# Patient Record
Sex: Female | Born: 1975 | Race: Black or African American | Hispanic: No | Marital: Single | Smoking: Current every day smoker
Health system: Southern US, Community
[De-identification: ages and names within clinical notes are randomized; demographics above are authoritative.]

## PROBLEM LIST (undated history)

## (undated) ENCOUNTER — Inpatient Hospital Stay (HOSPITAL_COMMUNITY): Payer: Self-pay

## (undated) DIAGNOSIS — J45909 Unspecified asthma, uncomplicated: Secondary | ICD-10-CM

## (undated) DIAGNOSIS — R51 Headache: Secondary | ICD-10-CM

## (undated) DIAGNOSIS — N390 Urinary tract infection, site not specified: Secondary | ICD-10-CM

## (undated) HISTORY — PX: NO PAST SURGERIES: SHX2092

---

## 2001-02-08 ENCOUNTER — Emergency Department (HOSPITAL_COMMUNITY): Admission: EM | Admit: 2001-02-08 | Discharge: 2001-02-08 | Payer: Self-pay | Admitting: Internal Medicine

## 2001-07-22 ENCOUNTER — Emergency Department (HOSPITAL_COMMUNITY): Admission: EM | Admit: 2001-07-22 | Discharge: 2001-07-22 | Payer: Self-pay | Admitting: Emergency Medicine

## 2001-07-22 ENCOUNTER — Encounter: Payer: Self-pay | Admitting: Emergency Medicine

## 2001-08-17 ENCOUNTER — Emergency Department (HOSPITAL_COMMUNITY): Admission: EM | Admit: 2001-08-17 | Discharge: 2001-08-17 | Payer: Self-pay | Admitting: *Deleted

## 2002-06-13 ENCOUNTER — Emergency Department (HOSPITAL_COMMUNITY): Admission: EM | Admit: 2002-06-13 | Discharge: 2002-06-13 | Payer: Self-pay | Admitting: Emergency Medicine

## 2003-03-01 ENCOUNTER — Emergency Department (HOSPITAL_COMMUNITY): Admission: EM | Admit: 2003-03-01 | Discharge: 2003-03-01 | Payer: Self-pay | Admitting: Emergency Medicine

## 2003-09-24 ENCOUNTER — Emergency Department (HOSPITAL_COMMUNITY): Admission: EM | Admit: 2003-09-24 | Discharge: 2003-09-24 | Payer: Self-pay | Admitting: Emergency Medicine

## 2005-11-13 ENCOUNTER — Emergency Department (HOSPITAL_COMMUNITY): Admission: EM | Admit: 2005-11-13 | Discharge: 2005-11-14 | Payer: Self-pay | Admitting: Emergency Medicine

## 2006-05-07 ENCOUNTER — Emergency Department (HOSPITAL_COMMUNITY): Admission: EM | Admit: 2006-05-07 | Discharge: 2006-05-07 | Payer: Self-pay | Admitting: Emergency Medicine

## 2006-10-24 ENCOUNTER — Emergency Department (HOSPITAL_COMMUNITY): Admission: EM | Admit: 2006-10-24 | Discharge: 2006-10-24 | Payer: Self-pay | Admitting: Emergency Medicine

## 2007-06-14 ENCOUNTER — Emergency Department (HOSPITAL_COMMUNITY): Admission: EM | Admit: 2007-06-14 | Discharge: 2007-06-14 | Payer: Self-pay | Admitting: Emergency Medicine

## 2007-06-22 ENCOUNTER — Emergency Department (HOSPITAL_COMMUNITY): Admission: EM | Admit: 2007-06-22 | Discharge: 2007-06-22 | Payer: Self-pay | Admitting: Emergency Medicine

## 2007-10-24 ENCOUNTER — Emergency Department (HOSPITAL_COMMUNITY): Admission: EM | Admit: 2007-10-24 | Discharge: 2007-10-24 | Payer: Self-pay | Admitting: Emergency Medicine

## 2008-01-06 ENCOUNTER — Emergency Department (HOSPITAL_COMMUNITY): Admission: EM | Admit: 2008-01-06 | Discharge: 2008-01-06 | Payer: Self-pay | Admitting: Emergency Medicine

## 2008-07-15 ENCOUNTER — Emergency Department (HOSPITAL_COMMUNITY): Admission: EM | Admit: 2008-07-15 | Discharge: 2008-07-15 | Payer: Self-pay | Admitting: Emergency Medicine

## 2009-01-25 ENCOUNTER — Emergency Department (HOSPITAL_COMMUNITY): Admission: EM | Admit: 2009-01-25 | Discharge: 2009-01-25 | Payer: Self-pay | Admitting: Family Medicine

## 2009-02-17 ENCOUNTER — Emergency Department (HOSPITAL_COMMUNITY): Admission: EM | Admit: 2009-02-17 | Discharge: 2009-02-17 | Payer: Self-pay | Admitting: Emergency Medicine

## 2010-12-06 ENCOUNTER — Emergency Department (HOSPITAL_COMMUNITY)
Admission: EM | Admit: 2010-12-06 | Discharge: 2010-12-07 | Disposition: A | Discharge status: Home / Self Care | Payer: Self-pay | Attending: Emergency Medicine | Admitting: Emergency Medicine

## 2010-12-06 DIAGNOSIS — N12 Tubulo-interstitial nephritis, not specified as acute or chronic: Secondary | ICD-10-CM | POA: Insufficient documentation

## 2010-12-06 DIAGNOSIS — R112 Nausea with vomiting, unspecified: Secondary | ICD-10-CM | POA: Insufficient documentation

## 2010-12-06 DIAGNOSIS — J45909 Unspecified asthma, uncomplicated: Secondary | ICD-10-CM | POA: Insufficient documentation

## 2010-12-06 DIAGNOSIS — Z79899 Other long term (current) drug therapy: Secondary | ICD-10-CM | POA: Insufficient documentation

## 2010-12-06 LAB — CBC
HCT: 34.9 % — ABNORMAL LOW (ref 36.0–46.0)
Hemoglobin: 11.9 g/dL — ABNORMAL LOW (ref 12.0–15.0)
MCHC: 34.1 g/dL (ref 30.0–36.0)
MCV: 87.3 fL (ref 78.0–100.0)
WBC: 9.3 10*3/uL (ref 4.0–10.5)

## 2010-12-06 LAB — URINE MICROSCOPIC-ADD ON

## 2010-12-06 LAB — PREGNANCY, URINE: Preg Test, Ur: NEGATIVE

## 2010-12-06 LAB — BASIC METABOLIC PANEL
BUN: 3 mg/dL — ABNORMAL LOW (ref 6–23)
CO2: 27 mEq/L (ref 19–32)
Glucose, Bld: 104 mg/dL — ABNORMAL HIGH (ref 70–99)
Potassium: 2.8 mEq/L — ABNORMAL LOW (ref 3.5–5.1)
Sodium: 135 mEq/L (ref 135–145)

## 2010-12-06 LAB — DIFFERENTIAL
Basophils Absolute: 0 10*3/uL (ref 0.0–0.1)
Lymphocytes Relative: 17 % (ref 12–46)
Lymphs Abs: 1.6 10*3/uL (ref 0.7–4.0)
Monocytes Absolute: 1.4 10*3/uL — ABNORMAL HIGH (ref 0.1–1.0)
Neutro Abs: 6.3 10*3/uL (ref 1.7–7.7)

## 2010-12-06 LAB — URINALYSIS, ROUTINE W REFLEX MICROSCOPIC
Bilirubin Urine: NEGATIVE
Specific Gravity, Urine: 1.01 (ref 1.005–1.030)
pH: 6 (ref 5.0–8.0)

## 2010-12-11 LAB — WET PREP, GENITAL

## 2010-12-11 LAB — DIFFERENTIAL
Basophils Relative: 0 % (ref 0–1)
Eosinophils Absolute: 0.1 10*3/uL (ref 0.0–0.7)
Lymphs Abs: 1.3 10*3/uL (ref 0.7–4.0)
Neutrophils Relative %: 80 % — ABNORMAL HIGH (ref 43–77)

## 2010-12-11 LAB — POCT PREGNANCY, URINE
Preg Test, Ur: NEGATIVE
Preg Test, Ur: NEGATIVE

## 2010-12-11 LAB — BASIC METABOLIC PANEL
BUN: 5 mg/dL — ABNORMAL LOW (ref 6–23)
Chloride: 100 mEq/L (ref 96–112)
Creatinine, Ser: 0.79 mg/dL (ref 0.4–1.2)

## 2010-12-11 LAB — GC/CHLAMYDIA PROBE AMP, GENITAL
Chlamydia, DNA Probe: NEGATIVE
GC Probe Amp, Genital: NEGATIVE

## 2010-12-11 LAB — URINALYSIS, ROUTINE W REFLEX MICROSCOPIC
Ketones, ur: NEGATIVE mg/dL
Nitrite: NEGATIVE
Protein, ur: NEGATIVE mg/dL
Urobilinogen, UA: 1 mg/dL (ref 0.0–1.0)

## 2010-12-11 LAB — CBC
MCHC: 34.7 g/dL (ref 30.0–36.0)
MCV: 89.9 fL (ref 78.0–100.0)
Platelets: 249 10*3/uL (ref 150–400)
WBC: 10.9 10*3/uL — ABNORMAL HIGH (ref 4.0–10.5)

## 2010-12-11 LAB — URINE MICROSCOPIC-ADD ON

## 2011-12-19 ENCOUNTER — Encounter (HOSPITAL_COMMUNITY): Payer: Self-pay | Admitting: Emergency Medicine

## 2011-12-19 ENCOUNTER — Emergency Department (HOSPITAL_COMMUNITY): Payer: Self-pay

## 2011-12-19 ENCOUNTER — Emergency Department (HOSPITAL_COMMUNITY)
Admission: EM | Admit: 2011-12-19 | Discharge: 2011-12-19 | Disposition: A | Discharge status: Home / Self Care | Payer: Self-pay | Attending: Emergency Medicine | Admitting: Emergency Medicine

## 2011-12-19 DIAGNOSIS — N390 Urinary tract infection, site not specified: Secondary | ICD-10-CM | POA: Insufficient documentation

## 2011-12-19 DIAGNOSIS — M549 Dorsalgia, unspecified: Secondary | ICD-10-CM | POA: Insufficient documentation

## 2011-12-19 DIAGNOSIS — J45909 Unspecified asthma, uncomplicated: Secondary | ICD-10-CM | POA: Insufficient documentation

## 2011-12-19 HISTORY — DX: Urinary tract infection, site not specified: N39.0

## 2011-12-19 LAB — POCT I-STAT, CHEM 8
Calcium, Ion: 1.18 mmol/L (ref 1.12–1.32)
Chloride: 102 mEq/L (ref 96–112)
Glucose, Bld: 104 mg/dL — ABNORMAL HIGH (ref 70–99)
HCT: 40 % (ref 36.0–46.0)
TCO2: 26 mmol/L (ref 0–100)

## 2011-12-19 LAB — URINALYSIS, ROUTINE W REFLEX MICROSCOPIC
Bilirubin Urine: NEGATIVE
Glucose, UA: NEGATIVE mg/dL
Ketones, ur: NEGATIVE mg/dL
Protein, ur: 100 mg/dL — AB

## 2011-12-19 LAB — URINE MICROSCOPIC-ADD ON

## 2011-12-19 LAB — CBC
HCT: 38.4 % (ref 36.0–46.0)
Hemoglobin: 13 g/dL (ref 12.0–15.0)
MCH: 29.3 pg (ref 26.0–34.0)
MCHC: 33.9 g/dL (ref 30.0–36.0)

## 2011-12-19 LAB — BASIC METABOLIC PANEL
Chloride: 102 mEq/L (ref 96–112)
GFR calc Af Amer: >90 mL/min (ref >90)
GFR calc non Af Amer: 80 mL/min — ABNORMAL LOW (ref >90)
Potassium: 3.7 mEq/L (ref 3.5–5.1)
Sodium: 136 mEq/L (ref 135–145)

## 2011-12-19 LAB — DIFFERENTIAL
Basophils Absolute: 0 10*3/uL (ref 0.0–0.1)
Basophils Relative: 0 % (ref 0–1)
Eosinophils Absolute: 0.1 10*3/uL (ref 0.0–0.7)
Neutro Abs: 6.6 10*3/uL (ref 1.7–7.7)
Neutrophils Relative %: 65 % (ref 43–77)

## 2011-12-19 MED ORDER — CIPROFLOXACIN IN D5W 400 MG/200ML IV SOLN
400.0000 mg | Freq: Once | INTRAVENOUS | Status: AC
  Administered 2011-12-19: 400 mg via INTRAVENOUS
  Filled 2011-12-19: qty 200

## 2011-12-19 MED ORDER — MORPHINE SULFATE 4 MG/ML IJ SOLN
4.0000 mg | Freq: Once | INTRAMUSCULAR | Status: AC
  Administered 2011-12-19: 4 mg via INTRAVENOUS
  Filled 2011-12-19: qty 1

## 2011-12-19 MED ORDER — NITROFURANTOIN MONOHYD MACRO 100 MG PO CAPS: 100.0000 mg | ORAL_CAPSULE | Freq: Two times a day (BID) | ORAL | Status: AC

## 2011-12-19 MED ORDER — HYDROCODONE-ACETAMINOPHEN 5-500 MG PO TABS: 1.0000 | ORAL_TABLET | Freq: Four times a day (QID) | ORAL | Status: AC | PRN

## 2011-12-19 MED ORDER — PROMETHAZINE HCL 25 MG/ML IJ SOLN
12.5000 mg | Freq: Once | INTRAMUSCULAR | Status: AC
  Administered 2011-12-19: 12.5 mg via INTRAVENOUS
  Filled 2011-12-19: qty 1

## 2011-12-19 MED ORDER — IBUPROFEN 800 MG PO TABS: 800.0000 mg | ORAL_TABLET | Freq: Three times a day (TID) | ORAL | Status: AC

## 2011-12-19 NOTE — ED Notes (Signed)
Patient transported to CT 

## 2011-12-19 NOTE — ED Notes (Signed)
PT to ED c/o acute onset R side pain that radiates to R flank.  Pt denies nvd.  Pt denies urinary s/s, vaginal drainage.  Pt states that at first she felt that she had "slept on her side wrong".

## 2011-12-19 NOTE — ED Notes (Signed)
Patient reporting sharp right upper abdominal quadrant pain that radiates to her right flank area.  Denies history of kidney stones.  Patient reports urinary tract infections and feels that these symptoms are similar to past UTI diagnoses.  Patient does reports urinary frequency; denies other urinary symptoms.  Patient denies chest pain and shortness of breath.  Reports nausea; denies vomiting.

## 2011-12-19 NOTE — ED Notes (Signed)
Lab at bedside

## 2011-12-19 NOTE — Discharge Instructions (Signed)
Back Pain, Adult Resting take antibiotics as prescribed. Sure to drink plenty of fluids. Followup in 2 days for recheck and culture results. Return here for any worsening condition.   Low back pain is very common. About 1 in 5 people have back pain.The cause of low back pain is rarely dangerous. The pain often gets better over time.About half of people with a sudden onset of back pain feel better in just 2 weeks. About 8 in 10 people feel better by 6 weeks.  CAUSES Some common causes of back pain include:  Strain of the muscles or ligaments supporting the spine.   Wear and tear (degeneration) of the spinal discs.   Arthritis.   Direct injury to the back.  DIAGNOSIS Most of the time, the direct cause of low back pain is not known.However, back pain can be treated effectively even when the exact cause of the pain is unknown.Answering your caregiver's questions about your overall health and symptoms is one of the most accurate ways to make sure the cause of your pain is not dangerous. If your caregiver needs more information, he or she may order lab work or imaging tests (X-rays or MRIs).However, even if imaging tests show changes in your back, this usually does not require surgery. HOME CARE INSTRUCTIONS For many people, back pain returns.Since low back pain is rarely dangerous, it is often a condition that people can learn to [facility] their own.   Remain active. It is stressful on the back to sit or stand in one place. Do not sit, drive, or stand in one place for more than 30 minutes at a time. Take short walks on level surfaces as soon as pain allows.Try to increase the length of time you walk each day.   Do not stay in bed.Resting more than 1 or 2 days can delay your recovery.   Do not avoid exercise or work.Your body is made to move.It is not dangerous to be active, even though your back may hurt.Your back will likely heal faster if you return to being active before your pain is  gone.   Pay attention to your body when you bend and lift. Many people have less discomfortwhen lifting if they bend their knees, keep the load close to their bodies,and avoid twisting. Often, the most comfortable positions are those that put less stress on your recovering back.   Find a comfortable position to sleep. Use a firm mattress and lie on your side with your knees slightly bent. If you lie on your back, put a pillow under your knees.   Only take over-the-counter or prescription medicines as directed by your caregiver. Over-the-counter medicines to reduce pain and inflammation are often the most helpful.Your caregiver may prescribe muscle relaxant drugs.These medicines help dull your pain so you can more quickly return to your normal activities and healthy exercise.   Put ice on the injured area.   Put ice in a plastic bag.   Place a towel between your skin and the bag.   Leave the ice on for 15 to 20 minutes, 3 to 4 times a day for the first 2 to 3 days. After that, ice and heat may be alternated to reduce pain and spasms.   Ask your caregiver about trying back exercises and gentle massage. This may be of some benefit.   Avoid feeling anxious or stressed.Stress increases muscle tension and can worsen back pain.It is important to recognize when you are anxious or stressed and learn ways  to manage it.Exercise is a great option.  SEEK MEDICAL CARE IF:  You have pain that is not relieved with rest or medicine.   You have pain that does not improve in 1 week.   You have new symptoms.   You are generally not feeling well.  SEEK IMMEDIATE MEDICAL CARE IF:   You have pain that radiates from your back into your legs.   You develop new bowel or bladder control problems.   You have unusual weakness or numbness in your arms or legs.   You develop nausea or vomiting.   You develop abdominal pain.   You feel faint.

## 2011-12-19 NOTE — ED Provider Notes (Signed)
History     CSN: [id number]  Arrival date & time 12/19/11  0143   First MD Initiated Contact with Patient 12/19/11 0426      No chief complaint on file.   (Consider location/radiation/quality/duration/timing/severity/associated sxs/prior treatment) HPI History provided by patient. Right-sided back pain with dysuria and UTI symptoms. She is having urinary frequency. No hematuria. No chest pain or shortness of breath. Some nausea but no vomiting. No fevers or chills. No history of kidney stones. No history of gallbladder problems. Pain is persistent and not associated with eating. She has not tried anything at home for the symptoms. Moderate in severity. Symptoms feel similar to previous UTI in the past. No known aggravating or alleviating factors. Past Medical History  Diagnosis Date  . Asthma   . Urinary tract infection     History reviewed. No pertinent past surgical history.  History reviewed. No pertinent family history.  History  Substance Use Topics  . Smoking status: Current Everyday Smoker -- 0.5 packs/day  . Smokeless tobacco: Not on file  . Alcohol Use: Yes    OB History    Grav Para Term Preterm Abortions TAB SAB Ect Mult Living                  Review of Systems  Constitutional: Negative for fever and chills.  HENT: Negative for neck pain and neck stiffness.   Eyes: Negative for pain.  Respiratory: Negative for shortness of breath.   Cardiovascular: Negative for chest pain.  Gastrointestinal: Negative for abdominal pain.  Genitourinary: Positive for frequency and flank pain. Negative for hematuria, vaginal bleeding and vaginal discharge.  Musculoskeletal: Negative for back pain.  Skin: Negative for rash.  Neurological: Negative for headaches.  All other systems reviewed and are negative.    Allergies  Shellfish allergy and Penicillins  Home Medications  No current outpatient prescriptions on file.  BP 111/67  Pulse 77  Temp(Src) 98.2 F (36.8  C) (Oral)  Resp 18  SpO2 100%  LMP 12/07/2011  Physical Exam  Constitutional: She is oriented to person, place, and time. She appears well-developed and well-nourished.  HENT:  Head: Normocephalic and atraumatic.  Eyes: Conjunctivae and EOM are normal. Pupils are equal, round, and reactive to light.  Neck: Trachea normal. Neck supple. No thyromegaly present.  Cardiovascular: Normal rate, regular rhythm, S1 normal, S2 normal and normal pulses.     No systolic murmur is present   No diastolic murmur is present  Pulses:      Radial pulses are 2+ on the right side, and 2+ on the left side.  Pulmonary/Chest: Effort normal and breath sounds normal. She has no wheezes. She has no rhonchi. She has no rales. She exhibits no tenderness.  Abdominal: Soft. Normal appearance and bowel sounds are normal. There is no tenderness. There is no rebound, no guarding, no CVA tenderness and negative Murphy's sign.       Localizes discomfort right flank but has no right upper quadrant tenderness, no CVA tenderness or reproducible symptoms  Musculoskeletal:       BLE:s Calves nontender, no cords or erythema, negative Homans sign  Neurological: She is alert and oriented to person, place, and time. She has normal strength. No cranial nerve deficit or sensory deficit. GCS eye subscore is 4. GCS verbal subscore is 5. GCS motor subscore is 6.  Skin: Skin is warm and dry. No rash noted. She is not diaphoretic.  Psychiatric: Her speech is normal.  Cooperative and appropriate    ED Course  Procedures (including critical care time)  Labs Reviewed  URINALYSIS, ROUTINE W REFLEX MICROSCOPIC - Abnormal; Notable for the following:    Color, Urine AMBER (*) BIOCHEMICALS MAY BE AFFECTED BY COLOR   APPearance TURBID (*)    Hgb urine dipstick LARGE (*)    Protein, ur 100 (*)    Nitrite POSITIVE (*)    Leukocytes, UA LARGE (*)    All other components within normal limits  BASIC METABOLIC PANEL - Abnormal; Notable  for the following:    Glucose, Bld 101 (*)    GFR calc non Af Amer 80 (*)    All other components within normal limits  URINE MICROSCOPIC-ADD ON - Abnormal; Notable for the following:    Squamous Epithelial / LPF MANY (*)    Bacteria, UA MANY (*)    All other components within normal limits  POCT I-STAT, CHEM 8 - Abnormal; Notable for the following:    BUN 4 (*)    Glucose, Bld 104 (*)    All other components within normal limits  POCT PREGNANCY, URINE  CBC  DIFFERENTIAL  URINE CULTURE   Ct Abdomen Pelvis Wo Contrast  12/19/2011  *RADIOLOGY REPORT*  Clinical Data: Right-sided flank pain and nausea.  CT ABDOMEN AND PELVIS WITHOUT CONTRAST  Technique:  Multidetector CT imaging of the abdomen and pelvis was performed following the standard protocol without intravenous contrast.  Comparison: Pelvic ultrasound performed 02/17/2009, and CT of the abdomen and pelvis performed 09/24/2003  Findings: Minimal bibasilar atelectasis is noted.  The liver and spleen are unremarkable in appearance.  The gallbladder is within normal limits.  The pancreas and adrenal glands are unremarkable.  The kidneys are unremarkable in appearance.  There is no evidence of hydronephrosis.  No renal or ureteral stones are seen.  No perinephric stranding is appreciated.  No free fluid is identified.  The small bowel is unremarkable in appearance.  The stomach is within normal limits.  No acute vascular abnormalities are seen.  The appendix is normal in caliber and contains air, without evidence for appendicitis.  The colon is grossly unremarkable in appearance.  Minimal presacral stranding is nonspecific in appearance.  The bladder is mildly distended and grossly unremarkable.  The uterus is grossly unremarkable in appearance, though difficult to fully assess without contrast.  The ovaries are relatively symmetric; no suspicious adnexal masses are seen.  No inguinal lymphadenopathy is seen.  No acute osseous abnormalities are  identified.  IMPRESSION: No acute abnormalities seen within the abdomen or pelvis.  Original Report Authenticated By: Tonia Ghent, M.D.    UA obtained and reviewed as above. IV antibiotics initiated. No fevers vomiting or clinical pyelo.  CT scan obtained to evaluate for possible kidney stone and reviewed as above - no abnormality seen.  Presentation and exam do not suggest cholecystitis.  MDM   UTI with flank pain but no kidney stone on CT. No hydro. Urine culture sent and pending. Plan antibiotics and outpatient followup in 48 hours for culture results and further evaluation. Patient stable for discharge home at this time. Reliable historian verbalizes understanding all discharge and followup instructions.        Sunnie Nielsen, MD 12/19/11 [phone]

## 2012-05-01 ENCOUNTER — Inpatient Hospital Stay (HOSPITAL_COMMUNITY)
Admission: AD | Admit: 2012-05-01 | Discharge: 2012-05-01 | Disposition: A | Discharge status: Home / Self Care | Payer: Medicaid Other | Attending: Obstetrics and Gynecology | Admitting: Obstetrics and Gynecology

## 2012-05-01 ENCOUNTER — Encounter (HOSPITAL_COMMUNITY): Payer: Self-pay | Admitting: Emergency Medicine

## 2012-05-01 DIAGNOSIS — N390 Urinary tract infection, site not specified: Secondary | ICD-10-CM | POA: Insufficient documentation

## 2012-05-01 DIAGNOSIS — M545 Low back pain, unspecified: Secondary | ICD-10-CM | POA: Insufficient documentation

## 2012-05-01 DIAGNOSIS — O98819 Other maternal infectious and parasitic diseases complicating pregnancy, unspecified trimester: Secondary | ICD-10-CM | POA: Insufficient documentation

## 2012-05-01 DIAGNOSIS — Z3201 Encounter for pregnancy test, result positive: Secondary | ICD-10-CM

## 2012-05-01 DIAGNOSIS — A599 Trichomoniasis, unspecified: Secondary | ICD-10-CM | POA: Diagnosis present

## 2012-05-01 DIAGNOSIS — O2390 Unspecified genitourinary tract infection in pregnancy, unspecified trimester: Secondary | ICD-10-CM | POA: Insufficient documentation

## 2012-05-01 DIAGNOSIS — A5901 Trichomonal vulvovaginitis: Secondary | ICD-10-CM | POA: Insufficient documentation

## 2012-05-01 HISTORY — DX: Headache: R51

## 2012-05-01 LAB — CBC WITH DIFFERENTIAL/PLATELET
Basophils Relative: 0 % (ref 0–1)
Eosinophils Absolute: 0.1 10*3/uL (ref 0.0–0.7)
HCT: 33.5 % — ABNORMAL LOW (ref 36.0–46.0)
Hemoglobin: 11.7 g/dL — ABNORMAL LOW (ref 12.0–15.0)
MCH: 29.8 pg (ref 26.0–34.0)
MCHC: 34.9 g/dL (ref 30.0–36.0)
Monocytes Absolute: 0.8 10*3/uL (ref 0.1–1.0)
Monocytes Relative: 9 % (ref 3–12)
Neutrophils Relative %: 66 % (ref 43–77)

## 2012-05-01 LAB — BASIC METABOLIC PANEL
BUN: 7 mg/dL (ref 6–23)
Creatinine, Ser: 0.69 mg/dL (ref 0.50–1.10)
GFR calc Af Amer: >90 mL/min (ref >90)
GFR calc non Af Amer: >90 mL/min (ref >90)

## 2012-05-01 LAB — WET PREP, GENITAL: Clue Cells Wet Prep HPF POC: NONE SEEN

## 2012-05-01 LAB — URINALYSIS, ROUTINE W REFLEX MICROSCOPIC
Bilirubin Urine: NEGATIVE
Ketones, ur: NEGATIVE mg/dL
Nitrite: POSITIVE — AB
Protein, ur: NEGATIVE mg/dL
pH: 7 (ref 5.0–8.0)

## 2012-05-01 MED ORDER — ONDANSETRON 8 MG PO TBDP
8.0000 mg | ORAL_TABLET | ORAL | Status: AC
  Administered 2012-05-01: 8 mg via ORAL
  Filled 2012-05-01: qty 1

## 2012-05-01 MED ORDER — NITROFURANTOIN MONOHYD MACRO 100 MG PO CAPS: 100.0000 mg | ORAL_CAPSULE | Freq: Two times a day (BID) | ORAL | Status: DC

## 2012-05-01 MED ORDER — METRONIDAZOLE 500 MG PO TABS
2000.0000 mg | ORAL_TABLET | ORAL | Status: AC
  Administered 2012-05-01: 2000 mg via ORAL
  Filled 2012-05-01: qty 4

## 2012-05-01 NOTE — ED Notes (Signed)
Patient is one the phone and asking how long the wait is.  Explained to patient and patient is cursing and states that " Shit, I am in pain too and I can't be waiting for this shit."  Patient sat down and kept cursing on the phone.  [employer] spoke to patient

## 2012-05-01 NOTE — MAU Note (Cosign Needed)
Pt had not been informed that her preg test was positive.  Pt in shock- has never been pregnant  And did not ever  want to be pregnant.

## 2012-05-01 NOTE — ED Notes (Signed)
Pt was called could be found. gpd at nurse first states  That she was cursing and acting out in triage waiting

## 2012-05-01 NOTE — MAU Provider Note (Signed)
Chief Complaint: Back Pain and Breast Pain   First Provider Initiated Contact with Patient 05/01/12 1504     SUBJECTIVE HPI: Mary [name] is a 36 y.o. G2P0010 at [redacted]w[redacted]d by LMP who presents to maternity admissions reporting low back pain with onset this morning and breast enlargement and soreness x1 month.  She is initially unsure of her LMP but knows it is in June, but after talking with her friend in the room, she is confident it is the second week of June, around 02/12/12.  She was using condoms for birth control, but was not consistent with their use.  She denies abdominal pain, vaginal bleeding, vaginal itching/burning, urinary symptoms, h/a, dizziness, n/v, or fever/chills.     Past Medical History  Diagnosis Date  . Asthma   . Urinary tract infection   . Headache    Past Surgical History  Procedure Date  . No past surgeries    History   Social History  . Marital Status: Single    Spouse Name: N/A    Number of Children: N/A  . Years of Education: N/A   Occupational History  . Not on file.   Social History Main Topics  . Smoking status: Current Everyday Smoker -- 0.5 packs/day for 10 years    Types: Cigarettes  . Smokeless tobacco: Never Used  . Alcohol Use: Yes  . Drug Use: Yes    Special: Marijuana     dailly- until recently  . Sexually Active: Not Currently   Other Topics Concern  . Not on file   Social History Narrative  . No narrative on file   No current facility-administered medications on file prior to encounter.   No current outpatient prescriptions on file prior to encounter.   Allergies  Allergen Reactions  . Shellfish Allergy     hives  . Penicillins Rash    ROS: Pertinent items in HPI  OBJECTIVE Blood pressure 102/54, pulse 83, temperature 98.7 F (37.1 C), temperature source Oral, resp. rate 16, last menstrual period 02/12/2012, SpO2 100.00%. GENERAL: Well-developed, well-nourished female in no acute distress.  HEENT:  Normocephalic HEART: normal rate RESP: normal effort ABDOMEN: Soft, non-tender EXTREMITIES: Nontender, no edema NEURO: Alert and oriented Pelvic exam: Cervix pink, visually closed, without lesion, frothy yellow discharge, vaginal walls and external genitalia normal Bimanual exam: Cervix 0/long/high, firm, anterior, neg CMT, uterus nontender, 10-11 week size, adnexa without tenderness, enlargement, or mass  LAB RESULTS Results for orders placed during the hospital encounter of 05/01/12 (from the past 24 hour(s))  URINALYSIS, ROUTINE W REFLEX MICROSCOPIC     Status: Abnormal   Collection Time   05/01/12 11:29 AM      Component Value Range   Color, Urine YELLOW  YELLOW   APPearance CLOUDY (*) CLEAR   Specific Gravity, Urine 1.017  1.005 - 1.030   pH 7.0  5.0 - 8.0   Glucose, UA NEGATIVE  NEGATIVE mg/dL   Hgb urine dipstick TRACE (*) NEGATIVE   Bilirubin Urine NEGATIVE  NEGATIVE   Ketones, ur NEGATIVE  NEGATIVE mg/dL   Protein, ur NEGATIVE  NEGATIVE mg/dL   Urobilinogen, UA 0.2  0.0 - 1.0 mg/dL   Nitrite POSITIVE (*) NEGATIVE   Leukocytes, UA MODERATE (*) NEGATIVE  URINE MICROSCOPIC-ADD ON     Status: Abnormal   Collection Time   05/01/12 11:29 AM      Component Value Range   Squamous Epithelial / LPF FEW (*) RARE   WBC, UA 11-20  <3 WBC/hpf  RBC / HPF 0-2  <3 RBC/hpf   Bacteria, UA MANY (*) RARE   Urine-Other TRICHOMONAS PRESENT    CBC WITH DIFFERENTIAL     Status: Abnormal   Collection Time   05/01/12 11:35 AM      Component Value Range   WBC 8.9  4.0 - 10.5 K/uL   RBC 3.93  3.87 - 5.11 MIL/uL   Hemoglobin 11.7 (*) 12.0 - 15.0 g/dL   HCT 16.1 (*) 09.6 - 04.5 %   MCV 85.2  78.0 - 100.0 fL   MCH 29.8  26.0 - 34.0 pg   MCHC 34.9  30.0 - 36.0 g/dL   RDW 40.9  81.1 - 91.4 %   Platelets 267  150 - 400 K/uL   Neutrophils Relative 66  43 - 77 %   Neutro Abs 5.8  1.7 - 7.7 K/uL   Lymphocytes Relative 24  12 - 46 %   Lymphs Abs 2.2  0.7 - 4.0 K/uL   Monocytes Relative 9  3 - 12  %   Monocytes Absolute 0.8  0.1 - 1.0 K/uL   Eosinophils Relative 1  0 - 5 %   Eosinophils Absolute 0.1  0.0 - 0.7 K/uL   Basophils Relative 0  0 - 1 %   Basophils Absolute 0.0  0.0 - 0.1 K/uL  BASIC METABOLIC PANEL     Status: Abnormal   Collection Time   05/01/12 11:35 AM      Component Value Range   Sodium 133 (*) 135 - 145 mEq/L   Potassium 3.9  3.5 - 5.1 mEq/L   Chloride 100  96 - 112 mEq/L   CO2 27  19 - 32 mEq/L   Glucose, Bld 94  70 - 99 mg/dL   BUN 7  6 - 23 mg/dL   Creatinine, Ser 7.82  0.50 - 1.10 mg/dL   Calcium 9.8  8.4 - 95.6 mg/dL   GFR calc non Af Amer >90  >90 mL/min   GFR calc Af Amer >90  >90 mL/min  POCT PREGNANCY, URINE     Status: Abnormal   Collection Time   05/01/12 11:40 AM      Component Value Range   Preg Test, Ur POSITIVE (*) NEGATIVE   FHT 140 by doppler  ASSESSMENT   PLAN Flagyl 2g x1 dose in MAU Discharge home Macrobid 100 mg BID x 7 days Urine sent for culture F/U with early prenatal care Pregnancy verification letter given   Sharen Counter Certified Nurse-Midwife 05/01/2012  3:26 PM

## 2012-05-01 NOTE — MAU Note (Addendum)
Pain in low back started 2 days ago, is getting worse. Breast pain, tenderness- past wk.  Asthma has been acting up lately.

## 2012-05-01 NOTE — ED Notes (Signed)
Lower back pain no injury , breast swelling  X 1 week deneies preg tho no period x 2 months she sates  And asthma acting up she staes

## 2012-05-02 LAB — GC/CHLAMYDIA PROBE AMP, GENITAL
Chlamydia, DNA Probe: NEGATIVE
GC Probe Amp, Genital: NEGATIVE

## 2012-05-02 NOTE — MAU Provider Note (Signed)
Agree with above note.  Mary [name] 05/02/2012 4:02 AM

## 2012-05-10 ENCOUNTER — Encounter (HOSPITAL_COMMUNITY): Payer: Self-pay

## 2012-05-10 ENCOUNTER — Inpatient Hospital Stay (HOSPITAL_COMMUNITY)
Admission: AD | Admit: 2012-05-10 | Discharge: 2012-05-10 | Disposition: A | Discharge status: Home / Self Care | Payer: Medicaid Other | Source: Ambulatory Visit | Attending: Obstetrics & Gynecology | Admitting: Obstetrics & Gynecology

## 2012-05-10 DIAGNOSIS — R109 Unspecified abdominal pain: Secondary | ICD-10-CM | POA: Insufficient documentation

## 2012-05-10 DIAGNOSIS — O219 Vomiting of pregnancy, unspecified: Secondary | ICD-10-CM

## 2012-05-10 DIAGNOSIS — O99330 Smoking (tobacco) complicating pregnancy, unspecified trimester: Secondary | ICD-10-CM | POA: Insufficient documentation

## 2012-05-10 DIAGNOSIS — O210 Mild hyperemesis gravidarum: Secondary | ICD-10-CM | POA: Insufficient documentation

## 2012-05-10 DIAGNOSIS — R51 Headache: Secondary | ICD-10-CM | POA: Insufficient documentation

## 2012-05-10 LAB — URINE MICROSCOPIC-ADD ON

## 2012-05-10 LAB — URINALYSIS, ROUTINE W REFLEX MICROSCOPIC
Bilirubin Urine: NEGATIVE
Protein, ur: NEGATIVE mg/dL
Urobilinogen, UA: 0.2 mg/dL (ref 0.0–1.0)

## 2012-05-10 MED ORDER — PROMETHAZINE HCL 25 MG PO TABS: 25.0000 mg | ORAL_TABLET | Freq: Four times a day (QID) | ORAL | Status: DC | PRN

## 2012-05-10 MED ORDER — ACETAMINOPHEN 325 MG PO TABS
650.0000 mg | ORAL_TABLET | Freq: Once | ORAL | Status: AC
  Administered 2012-05-10: 650 mg via ORAL
  Filled 2012-05-10: qty 2

## 2012-05-10 MED ORDER — ONDANSETRON 8 MG PO TBDP
8.0000 mg | ORAL_TABLET | Freq: Once | ORAL | Status: AC
  Administered 2012-05-10: 8 mg via ORAL
  Filled 2012-05-10: qty 1

## 2012-05-10 NOTE — MAU Provider Note (Signed)
History     CSN: [id number]  Arrival date and time: 05/10/12 0145   First Provider Initiated Contact with Patient 05/10/12 0215      Chief Complaint  Patient presents with  . Abdominal Pain   HPI Mary [name] 36 y.o. [redacted]w[redacted]d  Comes to MAU by EMS tonight with vomiting and headache.  Has not had any medication today.  Also had hot dogs today.  Has upper midline abdominal pain that she thinks is from vomiting.  Admits to smoking today.  Feels like she is dehydrated.  OB History    Grav Para Term Preterm Abortions TAB SAB Ect Mult Living   2    1  1          Past Medical History  Diagnosis Date  . Asthma   . Urinary tract infection   . Headache     Past Surgical History  Procedure Date  . No past surgeries     Family History  Problem Relation Age of Onset  . Other Neg Hx     History  Substance Use Topics  . Smoking status: Current Everyday Smoker -- 0.5 packs/day for 10 years    Types: Cigarettes  . Smokeless tobacco: Never Used  . Alcohol Use: Yes    Allergies:  Allergies  Allergen Reactions  . Shellfish Allergy     hives  . Penicillins Rash    Prescriptions prior to admission  Medication Sig Dispense Refill  . albuterol (PROVENTIL HFA;VENTOLIN HFA) 108 (90 BASE) MCG/ACT inhaler Inhale 2 puffs into the lungs every 6 (six) hours as needed.      . nitrofurantoin, macrocrystal-monohydrate, (MACROBID) 100 MG capsule Take 1 capsule (100 mg total) by mouth 2 (two) times daily.  14 capsule  0    Review of Systems  Gastrointestinal: Positive for nausea, vomiting and abdominal pain. Negative for diarrhea and constipation.       Upper midline abdominal pain  Genitourinary:       No vaginal discharge. No vaginal bleeding. No dysuria.   Physical Exam   Blood pressure 124/67, pulse 90, temperature 98.6 F (37 C), temperature source Oral, resp. rate 18, height 5\' 4"  (1.626 m), weight 161 lb (73.029 kg), last menstrual period 02/12/2012, SpO2 100.00%.  Physical  Exam  Nursing note and vitals reviewed. Constitutional: She is oriented to person, place, and time. She appears well-developed and well-nourished.  HENT:  Head: Normocephalic.  Eyes: EOM are normal.  Neck: Neck supple.  GI: Soft. There is tenderness. There is no rebound and no guarding.       Tender in upper midline FHT heard with doppler  Musculoskeletal: Normal range of motion.  Neurological: She is alert and oriented to person, place, and time.  Skin: Skin is warm and dry.  Psychiatric: She has a normal mood and affect.    MAU Course  Procedures  MDM Results for orders placed during the hospital encounter of 05/10/12 (from the past 24 hour(s))  URINALYSIS, ROUTINE W REFLEX MICROSCOPIC     Status: Abnormal   Collection Time   05/10/12  1:57 AM      Component Value Range   Color, Urine YELLOW  YELLOW   APPearance CLEAR  CLEAR   Specific Gravity, Urine 1.020  1.005 - 1.030   pH 6.0  5.0 - 8.0   Glucose, UA NEGATIVE  NEGATIVE mg/dL   Hgb urine dipstick SMALL (*) NEGATIVE   Bilirubin Urine NEGATIVE  NEGATIVE   Ketones, ur NEGATIVE  NEGATIVE mg/dL   Protein, ur NEGATIVE  NEGATIVE mg/dL   Urobilinogen, UA 0.2  0.0 - 1.0 mg/dL   Nitrite NEGATIVE  NEGATIVE   Leukocytes, UA NEGATIVE  NEGATIVE  URINE MICROSCOPIC-ADD ON     Status: Abnormal   Collection Time   05/10/12  1:57 AM      Component Value Range   Squamous Epithelial / LPF FEW (*) RARE   WBC, UA 3-6  <3 WBC/hpf   RBC / HPF 7-10  <3 RBC/hpf   Urine-Other MUCOUS PRESENT      Assessment and Plan  Nausea and vomiting in pregnancy Smoker  Plan Has an appointment to see [facility] on Tuesday BRAT diet advised Advised to stop smoking. Continue Macrobid until you have completed all medication. Rx phenergan 25 mg one by mouth every 4 hours as needed for nausea. (#20) no refills  BURLESON,TERRI 05/10/2012, 2:37 AM

## 2012-05-10 NOTE — MAU Note (Signed)
Pt reports upper mid abd pain x 2 days, nausea and vomiting x 2 days. Headache today. Pt reports she is 12 weeks preg.

## 2012-05-19 NOTE — MAU Provider Note (Signed)
Medical Screening exam and patient care preformed by advanced practice provider.  Agree with the above management.  

## 2012-05-28 LAB — OB RESULTS CONSOLE HEPATITIS B SURFACE ANTIGEN: Hepatitis B Surface Ag: NEGATIVE

## 2012-06-05 ENCOUNTER — Other Ambulatory Visit: Payer: Self-pay | Admitting: Obstetrics

## 2012-06-05 DIAGNOSIS — O9989 Other specified diseases and conditions complicating pregnancy, childbirth and the puerperium: Secondary | ICD-10-CM

## 2012-06-06 ENCOUNTER — Ambulatory Visit (HOSPITAL_COMMUNITY)
Admission: RE | Admit: 2012-06-06 | Discharge: 2012-06-06 | Disposition: A | Discharge status: Home / Self Care | Payer: Medicaid Other | Source: Ambulatory Visit | Attending: Obstetrics | Admitting: Obstetrics

## 2012-06-06 DIAGNOSIS — O9989 Other specified diseases and conditions complicating pregnancy, childbirth and the puerperium: Secondary | ICD-10-CM

## 2012-06-06 DIAGNOSIS — Z3689 Encounter for other specified antenatal screening: Secondary | ICD-10-CM | POA: Insufficient documentation

## 2012-07-25 ENCOUNTER — Encounter (HOSPITAL_COMMUNITY): Payer: Self-pay | Admitting: *Deleted

## 2012-07-25 ENCOUNTER — Inpatient Hospital Stay (HOSPITAL_COMMUNITY)
Admission: AD | Admit: 2012-07-25 | Discharge: 2012-07-25 | Disposition: A | Discharge status: Home / Self Care | Payer: Medicaid Other | Source: Ambulatory Visit | Attending: Obstetrics | Admitting: Obstetrics

## 2012-07-25 DIAGNOSIS — O26899 Other specified pregnancy related conditions, unspecified trimester: Secondary | ICD-10-CM

## 2012-07-25 DIAGNOSIS — O99891 Other specified diseases and conditions complicating pregnancy: Secondary | ICD-10-CM | POA: Insufficient documentation

## 2012-07-25 DIAGNOSIS — O99330 Smoking (tobacco) complicating pregnancy, unspecified trimester: Secondary | ICD-10-CM | POA: Insufficient documentation

## 2012-07-25 DIAGNOSIS — N644 Mastodynia: Secondary | ICD-10-CM | POA: Insufficient documentation

## 2012-07-25 DIAGNOSIS — R0602 Shortness of breath: Secondary | ICD-10-CM | POA: Insufficient documentation

## 2012-07-25 MED ORDER — GI COCKTAIL ~~LOC~~
30.0000 mL | Freq: Once | ORAL | Status: AC
  Administered 2012-07-25: 30 mL via ORAL
  Filled 2012-07-25: qty 30

## 2012-07-25 MED ORDER — ACETAMINOPHEN 325 MG PO TABS
650.0000 mg | ORAL_TABLET | Freq: Once | ORAL | Status: AC
  Administered 2012-07-25: 650 mg via ORAL
  Filled 2012-07-25: qty 2

## 2012-07-25 MED ORDER — ALBUTEROL SULFATE HFA 108 (90 BASE) MCG/ACT IN AERS
2.0000 | INHALATION_SPRAY | Freq: Once | RESPIRATORY_TRACT | Status: AC
  Administered 2012-07-25: 2 via RESPIRATORY_TRACT
  Filled 2012-07-25: qty 6.7

## 2012-07-25 MED ORDER — ALBUTEROL SULFATE (5 MG/ML) 0.5% IN NEBU: 2.5000 mg | INHALATION_SOLUTION | Freq: Once | RESPIRATORY_TRACT | Status: DC

## 2012-07-25 NOTE — MAU Note (Signed)
Pt reports shortness of breath for 2 days and a history of asthma but has not taken albuterol. Was unsure if able to take w/ pregnancy. Pt also reports breast pain, mainly in L breast for 2 days

## 2012-07-25 NOTE — MAU Provider Note (Signed)
History     CSN: [id number]  Arrival date and time: 07/25/12 4098   First Provider Initiated Contact with Patient 07/25/12 0330      Chief Complaint  Patient presents with  . Shortness of Breath  . Breast Pain   HPI Pt is G2P0010 miscarriage with her 1st pregnancy( early ).  Pt complains of shortness of breath for 2 days.  Pt is a smoker and has smoked 1/2 pack today.  Pt has a history of asthma and has an inhaler that she has not used recently.  This feels somewhat like her asthma tightening.  Pt denies spotting or bleeding or cramping or UTI symptoms,   She also has breast tenderness that is accentuated by her bra. She has increased 2 bra sizes during her pregnancy. She has an appointment with Dr. Clearance Coots on Nov 27 for her next OB visit. Pt admits to Marijuana use yesterday.  Pt is going to start Chantix prescription by Dr. Clearance Coots.  Past Medical History  Diagnosis Date  . Asthma   . Urinary tract infection   . Headache     Past Surgical History  Procedure Date  . No past surgeries     Family History  Problem Relation Age of Onset  . Other Neg Hx   . Alcohol abuse Neg Hx   . Birth defects Neg Hx   . Cancer Neg Hx   . COPD Neg Hx   . Stroke Neg Hx   . Vision loss Neg Hx   . Miscarriages / Stillbirths Neg Hx   . Mental retardation Neg Hx   . Mental illness Neg Hx   . Learning disabilities Neg Hx   . Kidney disease Neg Hx   . Heart disease Neg Hx   . Hearing loss Neg Hx   . Early death Neg Hx   . Arthritis Mother   . Depression Mother   . Hypertension Mother   . Drug abuse Mother   . Asthma Maternal Aunt   . Hyperlipidemia Maternal Aunt   . Diabetes Maternal Grandmother     History  Substance Use Topics  . Smoking status: Current Every Day Smoker -- 0.5 packs/day for 10 years    Types: Cigarettes  . Smokeless tobacco: Never Used  . Alcohol Use: Yes    Allergies:  Allergies  Allergen Reactions  . Shellfish Allergy     hives  . Penicillins Rash     Prescriptions prior to admission  Medication Sig Dispense Refill  . Prenatal Vit-Fe Fumarate-FA (MULTIVITAMIN-PRENATAL) 27-0.8 MG TABS Take 1 tablet by mouth daily.      Marland Kitchen albuterol (PROVENTIL HFA;VENTOLIN HFA) 108 (90 BASE) MCG/ACT inhaler Inhale 2 puffs into the lungs every 6 (six) hours as needed.      . promethazine (PHENERGAN) 25 MG tablet Take 1 tablet (25 mg total) by mouth every 6 (six) hours as needed for nausea. Can take 1/2 tablet for mild symptoms.  Generic OK.  30 tablet  0    Review of Systems  Constitutional: Negative for fever and chills.  Respiratory: Positive for shortness of breath. Negative for wheezing.   Cardiovascular: Negative for chest pain and palpitations.  Gastrointestinal: Negative for nausea, vomiting, abdominal pain, diarrhea and constipation.  Genitourinary: Negative for dysuria, urgency and frequency.   Physical Exam   Blood pressure 118/67, pulse 72, temperature 97.5 F (36.4 C), temperature source Oral, resp. rate 18, last menstrual period 02/12/2012, SpO2 100.00%.  Physical Exam  Nursing note and vitals  reviewed. Constitutional: She is oriented to person, place, and time. She appears well-developed and well-nourished.  HENT:  Head: Normocephalic.  Eyes: Pupils are equal, round, and reactive to light.  Neck: Normal range of motion. Neck supple.  Respiratory: Effort normal and breath sounds normal. No respiratory distress. She has no wheezes. She has no rales.       O2 sat 100%; breasts bilaterally tender; no palpable masses  Musculoskeletal: Normal range of motion.  Neurological: She is alert and oriented to person, place, and time.  Skin: Skin is warm and dry.  Psychiatric: She has a normal mood and affect.    MAU Course  Procedures Pt given an Albuterol inhaler, Tylenol, and GI cocktail Pt feeling better and ready to go home after Tylenol and GI cocktail No results found for this or any previous visit (from the past 24  hour(s)). Assessment and Plan  SOB in pregnancy Hx of Asthma- inhaler given F/u with Dr. Clearance Coots Report increase in symptoms  Esteban Kobashigawa 07/25/2012, 3:30 AM

## 2012-08-03 HISTORY — PX: MULTIPLE TOOTH EXTRACTIONS: SHX2053

## 2012-11-04 ENCOUNTER — Inpatient Hospital Stay (HOSPITAL_COMMUNITY)
Admission: AD | Admit: 2012-11-04 | Discharge: 2012-11-04 | Disposition: A | Discharge status: Home / Self Care | Payer: Medicaid Other | Source: Ambulatory Visit | Attending: Obstetrics | Admitting: Obstetrics

## 2012-11-04 ENCOUNTER — Encounter (HOSPITAL_COMMUNITY): Payer: Self-pay | Admitting: *Deleted

## 2012-11-04 DIAGNOSIS — R059 Cough, unspecified: Secondary | ICD-10-CM | POA: Insufficient documentation

## 2012-11-04 DIAGNOSIS — R109 Unspecified abdominal pain: Secondary | ICD-10-CM | POA: Insufficient documentation

## 2012-11-04 DIAGNOSIS — R05 Cough: Secondary | ICD-10-CM | POA: Insufficient documentation

## 2012-11-04 DIAGNOSIS — J3489 Other specified disorders of nose and nasal sinuses: Secondary | ICD-10-CM | POA: Insufficient documentation

## 2012-11-04 DIAGNOSIS — O99891 Other specified diseases and conditions complicating pregnancy: Secondary | ICD-10-CM | POA: Insufficient documentation

## 2012-11-04 DIAGNOSIS — J069 Acute upper respiratory infection, unspecified: Secondary | ICD-10-CM | POA: Insufficient documentation

## 2012-11-04 MED ORDER — ACETAMINOPHEN 325 MG PO TABS
650.0000 mg | ORAL_TABLET | Freq: Once | ORAL | Status: AC
  Administered 2012-11-04: 650 mg via ORAL
  Filled 2012-11-04: qty 2

## 2012-11-04 MED ORDER — DM-GUAIFENESIN ER 30-600 MG PO TB12
1.0000 | ORAL_TABLET | Freq: Once | ORAL | Status: AC
  Administered 2012-11-04: 1 via ORAL
  Filled 2012-11-04: qty 1

## 2012-11-04 MED ORDER — SALINE SPRAY 0.65 % NA SOLN
1.0000 | NASAL | Status: DC | PRN
  Administered 2012-11-04: 1 via NASAL
  Filled 2012-11-04: qty 44

## 2012-11-04 NOTE — Progress Notes (Signed)
Dr. Clearance Coots notified of patient arrival with URI symptoms and contractions. Mary [name] assessed her for URI and will treat her. Cervix was closed, thick and long. Ok to discharge home per Dr. Clearance Coots.

## 2012-11-04 NOTE — MAU Note (Signed)
Contractions started 1-2 hours ago.   Has a cough- started last wkend.  Every time she coughs she has sharp pain in chest and left side.

## 2012-11-04 NOTE — MAU Provider Note (Signed)
History     CSN: [id number]  Arrival date and time: 11/04/12 1600   First Provider Initiated Contact with Patient 11/04/12 1730      Chief Complaint  Patient presents with  . Labor Eval   HPI  Pt is 40 w 0d pregnant and presents with nasal congestion and cough- she has white nasal discharge and yellow productive cough.  Pt also complains of contractions which started earlier today.  Pt denies leakage of fluid, spotting or bleeding..  Pt has asthma and has albuterol inhaler- which she used about 2 weeks ago.  Pt is a smoker and last smoked 2 weeks ago.    Past Medical History  Diagnosis Date  . Asthma   . Urinary tract infection   . Headache     Past Surgical History  Procedure Laterality Date  . No past surgeries      Family History  Problem Relation Age of Onset  . Other Neg Hx   . Alcohol abuse Neg Hx   . Birth defects Neg Hx   . Cancer Neg Hx   . COPD Neg Hx   . Stroke Neg Hx   . Vision loss Neg Hx   . Miscarriages / Stillbirths Neg Hx   . Mental retardation Neg Hx   . Mental illness Neg Hx   . Learning disabilities Neg Hx   . Kidney disease Neg Hx   . Heart disease Neg Hx   . Hearing loss Neg Hx   . Early death Neg Hx   . Arthritis Mother   . Depression Mother   . Hypertension Mother   . Drug abuse Mother   . Asthma Maternal Aunt   . Hyperlipidemia Maternal Aunt   . Diabetes Maternal Grandmother     History  Substance Use Topics  . Smoking status: Current Every Day Smoker -- 0.50 packs/day for 10 years    Types: Cigarettes  . Smokeless tobacco: Never Used  . Alcohol Use: Yes    Allergies:  Allergies  Allergen Reactions  . Shellfish Allergy     hives  . Penicillins Rash    Prescriptions prior to admission  Medication Sig Dispense Refill  . albuterol (PROVENTIL HFA;VENTOLIN HFA) 108 (90 BASE) MCG/ACT inhaler Inhale 2 puffs into the lungs every 6 (six) hours as needed.      . Prenatal Vit-Fe Fumarate-FA (PRENATAL MULTIVITAMIN) TABS Take 1  tablet by mouth daily at 12 noon.        ROS Physical Exam   Blood pressure 122/78, pulse 91, temperature 97.9 F (36.6 C), temperature source Oral, resp. rate 20, height 5' 5.5" (1.664 m), weight 176 lb (79.833 kg), last menstrual period 02/12/2012, SpO2 100.00%.  Physical Exam  Nursing note and vitals reviewed. Constitutional: She is oriented to person, place, and time. She appears well-developed and well-nourished.  HENT:  Head: Normocephalic.  Nasal congestion  Eyes: Pupils are equal, round, and reactive to light.  Neck: Normal range of motion. Neck supple.  Cardiovascular: Normal rate.   Respiratory: Effort normal and breath sounds normal. She has no wheezes. She has no rales.  Left lower rib pain with cough  GI: Soft.  Ctx every 10-12 minutes; FHR reactive  Genitourinary:  Cervix closed per Venia Carbon, RN  Musculoskeletal: Normal range of motion.  Neurological: She is alert and oriented to person, place, and time.  Skin: Skin is warm and dry.  Psychiatric: She has a normal mood and affect.    MAU Course  Procedures  Dr. Clearance Coots aware of pt's status    Assessment and Plan  URI in pregnancy- mucinex DM one every 12 hours; saline nasal spray abd pain in pregnancy  LINEBERRY,SUSAN 11/04/2012, 5:34 PM

## 2012-11-24 ENCOUNTER — Encounter (HOSPITAL_COMMUNITY): Payer: Self-pay

## 2012-11-24 ENCOUNTER — Inpatient Hospital Stay (HOSPITAL_COMMUNITY)
Admission: AD | Admit: 2012-11-24 | Discharge: 2012-11-24 | Disposition: A | Discharge status: Home / Self Care | Payer: Medicaid Other | Source: Ambulatory Visit | Attending: Obstetrics | Admitting: Obstetrics

## 2012-11-24 ENCOUNTER — Telehealth: Payer: Self-pay | Admitting: *Deleted

## 2012-11-24 DIAGNOSIS — O479 False labor, unspecified: Secondary | ICD-10-CM | POA: Insufficient documentation

## 2012-11-24 NOTE — MAU Note (Signed)
Dr. Gaynell Face notified of pt's c/o ctx's q25 minutes, denies bleeding or lof. Cervix closed. Pt states ?induction scheduled for tomorrow. RN to call BS to determine if pt is scheduled already. EFM tracing reactive.

## 2012-11-24 NOTE — Telephone Encounter (Signed)
Pt states she needs an appointment because she is 5 days over due- per patient records, she has not been seen in the office since 08/2012. Called patient to schedule an appointment and she states she is at the hospital because she was having contractions. Patient to call back if she is not in labor for an appointment.

## 2012-11-24 NOTE — MAU Note (Signed)
Pt states u/c's q25 minutes, denies bleeding or lof.

## 2012-11-24 NOTE — MAU Note (Signed)
Patient arrived by EMS with c/o contractions every 25 minutes.

## 2012-11-26 ENCOUNTER — Encounter: Payer: Self-pay | Admitting: *Deleted

## 2012-11-26 ENCOUNTER — Inpatient Hospital Stay (HOSPITAL_COMMUNITY)
Admission: AD | Admit: 2012-11-26 | Discharge: 2012-12-01 | DRG: 766 | Disposition: A | Discharge status: Home / Self Care | Payer: Medicaid Other | Source: Ambulatory Visit | Attending: Obstetrics & Gynecology | Admitting: Obstetrics & Gynecology

## 2012-11-26 ENCOUNTER — Encounter: Payer: Self-pay | Admitting: Obstetrics

## 2012-11-26 ENCOUNTER — Ambulatory Visit (INDEPENDENT_AMBULATORY_CARE_PROVIDER_SITE_OTHER): Payer: Medicaid Other | Admitting: Obstetrics

## 2012-11-26 DIAGNOSIS — O480 Post-term pregnancy: Principal | ICD-10-CM | POA: Diagnosis present

## 2012-11-26 DIAGNOSIS — O09529 Supervision of elderly multigravida, unspecified trimester: Secondary | ICD-10-CM | POA: Diagnosis present

## 2012-11-26 DIAGNOSIS — O99334 Smoking (tobacco) complicating childbirth: Secondary | ICD-10-CM | POA: Diagnosis present

## 2012-11-26 DIAGNOSIS — Z3400 Encounter for supervision of normal first pregnancy, unspecified trimester: Secondary | ICD-10-CM

## 2012-11-26 DIAGNOSIS — Z3403 Encounter for supervision of normal first pregnancy, third trimester: Secondary | ICD-10-CM

## 2012-11-26 LAB — CBC
MCHC: 34.4 g/dL (ref 30.0–36.0)
Platelets: 275 10*3/uL (ref 150–400)
RDW: 14.5 % (ref 11.5–15.5)

## 2012-11-26 LAB — GROUP B STREP BY PCR: Group B strep by PCR: NEGATIVE

## 2012-11-26 MED ORDER — MISOPROSTOL 25 MCG QUARTER TABLET
25.0000 ug | ORAL_TABLET | ORAL | Status: DC | PRN
  Administered 2012-11-26 – 2012-11-28 (×5): 25 ug via VAGINAL
  Filled 2012-11-26 (×6): qty 0.25
  Filled 2012-11-26: qty 1

## 2012-11-26 MED ORDER — LACTATED RINGERS IV SOLN
INTRAVENOUS | Status: DC
  Administered 2012-11-26 – 2012-11-29 (×10): via INTRAVENOUS

## 2012-11-26 MED ORDER — LIDOCAINE HCL (PF) 1 % IJ SOLN
30.0000 mL | INTRAMUSCULAR | Status: DC | PRN
  Filled 2012-11-26: qty 30

## 2012-11-26 MED ORDER — OXYTOCIN BOLUS FROM INFUSION: 500.0000 mL | INTRAVENOUS | Status: DC

## 2012-11-26 MED ORDER — NALBUPHINE SYRINGE 5 MG/0.5 ML
10.0000 mg | INJECTION | Freq: Four times a day (QID) | INTRAMUSCULAR | Status: DC | PRN
  Administered 2012-11-28 – 2012-11-29 (×3): 10 mg via INTRAMUSCULAR
  Filled 2012-11-26 (×4): qty 1

## 2012-11-26 MED ORDER — FLEET ENEMA 7-19 GM/118ML RE ENEM: 1.0000 | ENEMA | RECTAL | Status: DC | PRN

## 2012-11-26 MED ORDER — IBUPROFEN 600 MG PO TABS: 600.0000 mg | ORAL_TABLET | Freq: Four times a day (QID) | ORAL | Status: DC | PRN

## 2012-11-26 MED ORDER — TERBUTALINE SULFATE 1 MG/ML IJ SOLN: 0.2500 mg | Freq: Once | INTRAMUSCULAR | Status: AC | PRN

## 2012-11-26 MED ORDER — ONDANSETRON HCL 4 MG/2ML IJ SOLN: 4.0000 mg | Freq: Four times a day (QID) | INTRAMUSCULAR | Status: DC | PRN

## 2012-11-26 MED ORDER — LACTATED RINGERS IV SOLN: 500.0000 mL | INTRAVENOUS | Status: DC | PRN

## 2012-11-26 MED ORDER — CITRIC ACID-SODIUM CITRATE 334-500 MG/5ML PO SOLN
30.0000 mL | ORAL | Status: DC | PRN
  Administered 2012-11-29: 30 mL via ORAL
  Filled 2012-11-26: qty 15

## 2012-11-26 MED ORDER — ACETAMINOPHEN 325 MG PO TABS: 650.0000 mg | ORAL_TABLET | ORAL | Status: DC | PRN

## 2012-11-26 MED ORDER — PROMETHAZINE HCL 25 MG/ML IJ SOLN
25.0000 mg | Freq: Four times a day (QID) | INTRAMUSCULAR | Status: DC | PRN
  Administered 2012-11-28: 12.5 mg via INTRAMUSCULAR
  Administered 2012-11-29 (×2): 25 mg via INTRAMUSCULAR
  Filled 2012-11-26 (×3): qty 1

## 2012-11-26 MED ORDER — NALBUPHINE SYRINGE 5 MG/0.5 ML
10.0000 mg | INJECTION | INTRAMUSCULAR | Status: DC | PRN
  Administered 2012-11-28 – 2012-11-29 (×3): 10 mg via INTRAVENOUS
  Filled 2012-11-26: qty 0.5
  Filled 2012-11-26 (×3): qty 1
  Filled 2012-11-26: qty 0.5

## 2012-11-26 MED ORDER — OXYCODONE-ACETAMINOPHEN 5-325 MG PO TABS: 1.0000 | ORAL_TABLET | ORAL | Status: DC | PRN

## 2012-11-26 MED ORDER — OXYTOCIN 40 UNITS IN LACTATED RINGERS INFUSION - SIMPLE MED: 62.5000 mL/h | INTRAVENOUS | Status: DC

## 2012-11-26 NOTE — Progress Notes (Signed)
Pt. Sitting up eating lowered head of bed slightly and adjusted monitor

## 2012-11-26 NOTE — H&P (Signed)
Mary [name] is a 37 y.o. female presenting for IOL. Maternal Medical History:  Reason for admission: 19 y o G2 p0.  EDC 11-18-12.  Postdates.  Non reactive NST.  Admitted for IOL.   Fetal activity: Perceived fetal activity is normal.   Last perceived fetal movement was within the past hour.    Prenatal complications: no prenatal complications Prenatal Complications - Diabetes: none.    OB History   Grav Para Term Preterm Abortions TAB SAB Ect Mult Living   2    1  1         Past Medical History  Diagnosis Date  . Asthma   . Urinary tract infection   . Headache    Past Surgical History  Procedure Laterality Date  . No past surgeries     Family History: family history includes Arthritis in her mother; Asthma in her maternal aunt; Depression in her mother; Diabetes in her maternal grandmother; Drug abuse in her mother; Hyperlipidemia in her maternal aunt; and Hypertension in her mother.  There is no history of Other, and Alcohol abuse, and Birth defects, and Cancer, and COPD, and Stroke, and Vision loss, and Miscarriages / [place], and Mental retardation, and Mental illness, and Learning disabilities, and Kidney disease, and Heart disease, and Hearing loss, and Early death, . Social History:  reports that she has been smoking Cigarettes.  She has a 5 pack-year smoking history. She has never used smokeless tobacco. She reports that she uses illicit drugs (Marijuana). She reports that she does not drink alcohol.   Prenatal Transfer Tool  Maternal Diabetes: No                                                                                                                                                                                                              Maternal Ultrasounds/Referrals: Normal Fetal Ultrasounds or other Referrals:  None Maternal Substance Abuse:  No Significant Maternal Medications:  Meds include: Other:  PNV's Significant Maternal Lab Results:  Lab values  include: Other: Prenatal panel. Other Comments:  None  Review of Systems  All other systems reviewed and are negative.    Dilation: Fingertip Station: -3 Exam by:: Junita Push Blood pressure 130/63, pulse 77, temperature 97.9 F (36.6 C), temperature source Oral, resp. rate 18, last menstrual period 02/12/2012. Maternal Exam:  Abdomen: Patient reports no abdominal tenderness. Fetal presentation: vertex  Introitus: Normal vulva. Normal vagina.  Cervix: Cervix evaluated by digital exam.     Physical Exam  Nursing note and vitals  reviewed. Constitutional: She is oriented to person, place, and time. She appears well-developed and well-nourished.  HENT:  Head: Normocephalic and atraumatic.  Eyes: Conjunctivae are normal. Pupils are equal, round, and reactive to light.  Neck: Normal range of motion. Neck supple.  Cardiovascular: Normal rate and regular rhythm.   Respiratory: Effort normal and breath sounds normal.  GI: Soft.  Genitourinary: Vagina normal and uterus normal.  Musculoskeletal: Normal range of motion.  Neurological: She is alert and oriented to person, place, and time.  Skin: Skin is warm and dry.  Psychiatric: She has a normal mood and affect. Her behavior is normal. Judgment and thought content normal.    Prenatal labs: ABO, Rh:   Antibody:   Rubella:   RPR:    HBsAg:    HIV:    GBS:     Assessment/Plan:  41.1 weeks.  Nonreactive NST.  Admitted for 2 stage IOL.   HARPER,CHARLES A 11/26/2012, 7:42 PM

## 2012-11-26 NOTE — Progress Notes (Signed)
Pt. Sitting straight up in bed talking on phone. Lowered patient back slightly and FHR monitored

## 2012-11-27 LAB — RPR: RPR Ser Ql: NONREACTIVE

## 2012-11-27 MED ORDER — TERBUTALINE SULFATE 1 MG/ML IJ SOLN: 0.2500 mg | Freq: Once | INTRAMUSCULAR | Status: AC | PRN

## 2012-11-27 MED ORDER — OXYTOCIN 40 UNITS IN LACTATED RINGERS INFUSION - SIMPLE MED: 1.0000 m[IU]/min | INTRAVENOUS | Status: DC

## 2012-11-27 MED ORDER — OXYTOCIN 40 UNITS IN LACTATED RINGERS INFUSION - SIMPLE MED
1.0000 m[IU]/min | INTRAVENOUS | Status: DC
  Filled 2012-11-27: qty 1000

## 2012-11-27 MED ORDER — MISOPROSTOL 25 MCG QUARTER TABLET: 25.0000 ug | ORAL_TABLET | ORAL | Status: AC

## 2012-11-27 MED ORDER — OXYTOCIN 40 UNITS IN LACTATED RINGERS INFUSION - SIMPLE MED
1.0000 m[IU]/min | INTRAVENOUS | Status: DC
  Administered 2012-11-27: 2 m[IU]/min via INTRAVENOUS
  Filled 2012-11-27: qty 1000

## 2012-11-27 MED ORDER — ZOLPIDEM TARTRATE 5 MG PO TABS
5.0000 mg | ORAL_TABLET | Freq: Every evening | ORAL | Status: DC | PRN
  Administered 2012-11-27: 5 mg via ORAL
  Filled 2012-11-27: qty 1

## 2012-11-27 NOTE — Progress Notes (Signed)
Pt not found in her room.  Pt not found on unit and not showing up on her telemetry.

## 2012-11-27 NOTE — Progress Notes (Signed)
Pt refused cytotec at this time.  Pt will let us know when she is ready.

## 2012-11-27 NOTE — Progress Notes (Signed)
MD notified that pt back on the monitor after a "walk" and that pt stating that if she wasn't "doing something" by 8pm then she was going home because she needs to smoke.  Pt stating that she has been smoking for 20+ years and the baby was going to have to deal with it.  Pt offered to call MD for a nicotene patch and pt refusing. Per MD POC would be tenatively, if no changes, pit off at 5 and then cytotec through the night and allow pt to have dinner.  Pit restarted in the morning.

## 2012-11-27 NOTE — Progress Notes (Signed)
Pt still ambulating in the hallways

## 2012-11-27 NOTE — Progress Notes (Signed)
MD called per pt request.  D/C pitocin and SL her IVF and allow her to ambulate.  Allow pt to have a regular diet for dinner and shower and start cytotec at 10 pm. Then start pitocin back in the morning at 0600.

## 2012-11-27 NOTE — Progress Notes (Signed)
Pt taken off the monitor and instructed to order breakfast and take her walk.  Will start pitocin after breakfast

## 2012-11-27 NOTE — Progress Notes (Signed)
Mary [name] is a 37 y.o. G2P0010 at [redacted]w[redacted]d by LMP admitted for induction of labor due to Post dates. Due date 11-18-12.  Subjective:   Objective: BP 135/71  Pulse 63  Temp(Src) 98.3 F (36.8 C) (Oral)  Resp 18  LMP 02/12/2012      FHT:  FHR: 150 bpm, variability: moderate,  accelerations:  Present,  decelerations:  Absent UC:   regular, every 3 minutes, mild SVE:   Dilation: Fingertip Effacement (%): 50 Station: Ballotable Exam by:: [employer] RN  Labs: Lab Results  Component Value Date   WBC 11.7* 11/26/2012   HGB 12.2 11/26/2012   HCT 35.5* 11/26/2012   MCV 85.3 11/26/2012   PLT 275 11/26/2012    Assessment / Plan: 41.2 weeks.  Nonreactive NST.  2 stage IOL.  Labor: Latent Preeclampsia:  n/a Fetal Wellbeing:  Category I Pain Control:  Labor support without medications I/D:  n/a Anticipated MOD:  NSVD  HARPER,CHARLES A 11/27/2012, 8:30 AM

## 2012-11-27 NOTE — Progress Notes (Signed)
Pt requesting to ambulate before starting pitocin.  MD questioned and orders received to let pt ambulate and eat a regular diet for breakfast and then start pitocin after breakfast.

## 2012-11-27 NOTE — Progress Notes (Signed)
Dr. Clearance Coots called for update in status. Orders received

## 2012-11-27 NOTE — Progress Notes (Signed)
Pt taken off the monitor and ambulating

## 2012-11-27 NOTE — Progress Notes (Signed)
Pt found in the hallway off the L&D unit and instructed that even on the telemetry she HAS to stay on the Birthing Unit for [place] to be able to monitor her baby.

## 2012-11-28 ENCOUNTER — Encounter (HOSPITAL_COMMUNITY): Payer: Self-pay | Admitting: *Deleted

## 2012-11-28 DIAGNOSIS — O480 Post-term pregnancy: Secondary | ICD-10-CM | POA: Diagnosis present

## 2012-11-28 MED ORDER — DIPHENHYDRAMINE HCL 50 MG/ML IJ SOLN: 12.5000 mg | INTRAMUSCULAR | Status: DC | PRN

## 2012-11-28 MED ORDER — PHENYLEPHRINE 40 MCG/ML (10ML) SYRINGE FOR IV PUSH (FOR BLOOD PRESSURE SUPPORT): 80.0000 ug | PREFILLED_SYRINGE | INTRAVENOUS | Status: DC | PRN

## 2012-11-28 MED ORDER — EPHEDRINE 5 MG/ML INJ: 10.0000 mg | INTRAVENOUS | Status: DC | PRN

## 2012-11-28 MED ORDER — EPHEDRINE 5 MG/ML INJ
10.0000 mg | INTRAVENOUS | Status: DC | PRN
  Filled 2012-11-28 (×2): qty 4

## 2012-11-28 MED ORDER — FENTANYL 2.5 MCG/ML BUPIVACAINE 1/10 % EPIDURAL INFUSION (WH - ANES)
14.0000 mL/h | INTRAMUSCULAR | Status: DC | PRN
  Filled 2012-11-28 (×2): qty 125

## 2012-11-28 MED ORDER — PHENYLEPHRINE 40 MCG/ML (10ML) SYRINGE FOR IV PUSH (FOR BLOOD PRESSURE SUPPORT)
80.0000 ug | PREFILLED_SYRINGE | INTRAVENOUS | Status: DC | PRN
  Filled 2012-11-28 (×2): qty 5

## 2012-11-28 MED ORDER — LACTATED RINGERS IV SOLN
500.0000 mL | Freq: Once | INTRAVENOUS | Status: AC
  Administered 2012-11-29: 500 mL via INTRAVENOUS

## 2012-11-28 NOTE — Progress Notes (Addendum)
Called Dr. Gaynell Face, who is on call for Dr. Tamela Oddi.  Asked him if he's familiar with pt; did he get report yet from Dr. Tamela Oddi.  He stated he had. He's aware of foley bulb being placed; he asked what time; informed him was 0830.   Updated him on fhr, u/c pattern, times Nubain & Phenergan given, foley bulb still in place and have tugged on tubing; applied new traction five times today.  Also updated him on pt's comfort level since Nubain given, and current Pitocin rate, with previous order to remain at 6 mu/min.  Asked him if he wants to leave Pitocin at 6 mu or to start increasing rate.  He stated to leave Pitocin at 6 mu/min until foley falls out. He stated that when foley falls out, for RN to check cervix and call him with results for further orders.  Informed pt of further plan of care and that Dr. Gaynell Face will be her doctor this weekend.  Pt verbalized understanding and is in agreement with plan of care, stated she had no questions when RN asked her.

## 2012-11-28 NOTE — Progress Notes (Signed)
At 0725 this AM, this RN was going to start Pitocin drip. Received in shift change report that pt refused for it to be started at 0600 today.  Now the pt is stating she wants to walk first, then eat breakfast before Pitocin started.  Pt calm and pleasant.  This RN complied with pt, removed monitors so she may walk, and encouraged pt to order her breakfast before she walks, which she did.  Pt returned to her room by 0800, and was compliant with plan of care.  MD in to see pt at 0800; see MD's progress note and labor flowsheet for further details.

## 2012-11-28 NOTE — Progress Notes (Signed)
Patient ID: Mary [name], female   DOB: 02/23/1976, 37 y.o.   MRN: [id number] Mary [name] is [name] 37 y.o. G2P0010 at [redacted]w[redacted]d by LMP admitted for induction of labor due to Post dates. Due date 11-18-12.  Subjective: Comfortable  Objective: BP 112/64  Pulse 73  Temp(Src) 97.8 F (36.6 C) (Oral)  Resp 18  LMP 02/12/2012      FHT:  FHR: 150 bpm, variability: moderate,  accelerations:  Present,  decelerations:  Absent UC:   regular, every 3 minutes, mild SVE:   Dilation: Fingertip Effacement (%): 50 Station: Ballotable Exam by:: AL Rinehart RN [name] foley balloon was placed in the LUS Labs: Lab Results  Component Value Date   WBC 11.7* 11/26/2012   HGB 12.2 11/26/2012   HCT 35.5* 11/26/2012   MCV 85.3 11/26/2012   PLT 275 11/26/2012    Assessment / Plan: 38.0 weeks.  IOL. Bishop's score remains unfavorable   Labor:  See abobe Preeclampsia:  n/[name] Fetal Wellbeing:  Category I Pain Control:  Labor support without medications I/D:  n/[name] Anticipated MOD:  NSVD  JACKSON-MOORE,Mary [name] 11/28/2012, 8:49 AM

## 2012-11-28 NOTE — Progress Notes (Signed)
Had informed pt she could have light meal due to no pain with contractions, contracting irregularly for last 6-7 hours, Pitocin remains on 6mu since 1208 and for duration. Pt had been insistent that she had to eat something; clear liquids not sufficient enough when RN offered, brought jello, water, Sprite. Informed her several times reasons for restrictions during induction/labor.  Had instructed her not to eat anything greasy, heavy for meal.  When introducing her to [place], [place] for night shift, found her eating chicken tenders, fries. Reminded her she was not to eat greasy, heavy meal.  She replied, "it's not greasy".

## 2012-11-29 ENCOUNTER — Inpatient Hospital Stay (HOSPITAL_COMMUNITY): Payer: Medicaid Other | Admitting: Anesthesiology

## 2012-11-29 ENCOUNTER — Encounter (HOSPITAL_COMMUNITY): Payer: Self-pay | Admitting: *Deleted

## 2012-11-29 ENCOUNTER — Encounter (HOSPITAL_COMMUNITY): Payer: Self-pay | Admitting: Anesthesiology

## 2012-11-29 ENCOUNTER — Encounter (HOSPITAL_COMMUNITY)
Admission: AD | Disposition: A | Discharge status: Home / Self Care | Payer: Self-pay | Source: Ambulatory Visit | Attending: Obstetrics & Gynecology

## 2012-11-29 LAB — CBC
HCT: 35.4 % — ABNORMAL LOW (ref 36.0–46.0)
Hemoglobin: 12 g/dL (ref 12.0–15.0)
RBC: 4.13 MIL/uL (ref 3.87–5.11)
WBC: 14.2 10*3/uL — ABNORMAL HIGH (ref 4.0–10.5)

## 2012-11-29 SURGERY — Surgical Case
Anesthesia: Epidural | Site: Abdomen | Wound class: Clean Contaminated

## 2012-11-29 MED ORDER — OXYTOCIN 10 UNIT/ML IJ SOLN
INTRAMUSCULAR | Status: AC
  Filled 2012-11-29: qty 4

## 2012-11-29 MED ORDER — MORPHINE SULFATE (PF) 0.5 MG/ML IJ SOLN
INTRAMUSCULAR | Status: DC | PRN
  Administered 2012-11-29: 4 mg via EPIDURAL

## 2012-11-29 MED ORDER — NALOXONE HCL 1 MG/ML IJ SOLN
1.0000 ug/kg/h | INTRAVENOUS | Status: DC | PRN
  Filled 2012-11-29: qty 2

## 2012-11-29 MED ORDER — SODIUM CHLORIDE 0.9 % IR SOLN
Status: DC | PRN
  Administered 2012-11-29: 1000 mL

## 2012-11-29 MED ORDER — SIMETHICONE 80 MG PO CHEW
80.0000 mg | CHEWABLE_TABLET | Freq: Three times a day (TID) | ORAL | Status: DC
  Administered 2012-11-30 – 2012-12-01 (×5): 80 mg via ORAL

## 2012-11-29 MED ORDER — SODIUM BICARBONATE 8.4 % IV SOLN
INTRAVENOUS | Status: AC
  Filled 2012-11-29: qty 50

## 2012-11-29 MED ORDER — DIPHENHYDRAMINE HCL 50 MG/ML IJ SOLN: 25.0000 mg | INTRAMUSCULAR | Status: DC | PRN

## 2012-11-29 MED ORDER — ONDANSETRON HCL 4 MG/2ML IJ SOLN: 4.0000 mg | Freq: Three times a day (TID) | INTRAMUSCULAR | Status: DC | PRN

## 2012-11-29 MED ORDER — FENTANYL 2.5 MCG/ML BUPIVACAINE 1/10 % EPIDURAL INFUSION (WH - ANES)
INTRAMUSCULAR | Status: DC | PRN
  Administered 2012-11-29: 14 mL/h via EPIDURAL

## 2012-11-29 MED ORDER — MEPERIDINE HCL 25 MG/ML IJ SOLN: 6.2500 mg | INTRAMUSCULAR | Status: DC | PRN

## 2012-11-29 MED ORDER — MENTHOL 3 MG MT LOZG: 1.0000 | LOZENGE | OROMUCOSAL | Status: DC | PRN

## 2012-11-29 MED ORDER — SODIUM BICARBONATE 8.4 % IV SOLN
INTRAVENOUS | Status: DC | PRN
  Administered 2012-11-29 (×2): 5 mL via EPIDURAL

## 2012-11-29 MED ORDER — SCOPOLAMINE 1 MG/3DAYS TD PT72
MEDICATED_PATCH | TRANSDERMAL | Status: AC
  Filled 2012-11-29: qty 1

## 2012-11-29 MED ORDER — DIPHENHYDRAMINE HCL 50 MG/ML IJ SOLN: 12.5000 mg | INTRAMUSCULAR | Status: DC | PRN

## 2012-11-29 MED ORDER — MORPHINE SULFATE 0.5 MG/ML IJ SOLN
INTRAMUSCULAR | Status: AC
  Filled 2012-11-29: qty 10

## 2012-11-29 MED ORDER — KETOROLAC TROMETHAMINE 30 MG/ML IJ SOLN: 15.0000 mg | Freq: Once | INTRAMUSCULAR | Status: DC | PRN

## 2012-11-29 MED ORDER — NALBUPHINE HCL 10 MG/ML IJ SOLN
5.0000 mg | INTRAMUSCULAR | Status: DC | PRN
  Filled 2012-11-29: qty 1

## 2012-11-29 MED ORDER — NALOXONE HCL 0.4 MG/ML IJ SOLN: 0.4000 mg | INTRAMUSCULAR | Status: DC | PRN

## 2012-11-29 MED ORDER — OXYCODONE-ACETAMINOPHEN 5-325 MG PO TABS
1.0000 | ORAL_TABLET | ORAL | Status: DC | PRN
  Administered 2012-12-01 (×2): 1 via ORAL
  Filled 2012-11-29 (×2): qty 1

## 2012-11-29 MED ORDER — MEPERIDINE HCL 25 MG/ML IJ SOLN
6.2500 mg | INTRAMUSCULAR | Status: DC | PRN
  Administered 2012-11-29: 6.25 mg via INTRAVENOUS

## 2012-11-29 MED ORDER — METOCLOPRAMIDE HCL 5 MG/ML IJ SOLN: 10.0000 mg | Freq: Three times a day (TID) | INTRAMUSCULAR | Status: DC | PRN

## 2012-11-29 MED ORDER — DIBUCAINE 1 % RE OINT: 1.0000 "application " | TOPICAL_OINTMENT | RECTAL | Status: DC | PRN

## 2012-11-29 MED ORDER — DIPHENHYDRAMINE HCL 25 MG PO CAPS: 25.0000 mg | ORAL_CAPSULE | ORAL | Status: DC | PRN

## 2012-11-29 MED ORDER — HYDROMORPHONE HCL PF 1 MG/ML IJ SOLN: 0.2500 mg | INTRAMUSCULAR | Status: DC | PRN

## 2012-11-29 MED ORDER — TETANUS-DIPHTH-ACELL PERTUSSIS 5-2.5-18.5 LF-MCG/0.5 IM SUSP: 0.5000 mL | Freq: Once | INTRAMUSCULAR | Status: DC

## 2012-11-29 MED ORDER — OXYTOCIN 10 UNIT/ML IJ SOLN
40.0000 [IU] | INTRAVENOUS | Status: DC | PRN
  Administered 2012-11-29: 40 [IU] via INTRAVENOUS

## 2012-11-29 MED ORDER — DIPHENHYDRAMINE HCL 25 MG PO CAPS: 25.0000 mg | ORAL_CAPSULE | Freq: Four times a day (QID) | ORAL | Status: DC | PRN

## 2012-11-29 MED ORDER — KETOROLAC TROMETHAMINE 60 MG/2ML IM SOLN
60.0000 mg | Freq: Once | INTRAMUSCULAR | Status: AC | PRN
  Administered 2012-11-29: 60 mg via INTRAMUSCULAR

## 2012-11-29 MED ORDER — ONDANSETRON HCL 4 MG PO TABS: 4.0000 mg | ORAL_TABLET | ORAL | Status: DC | PRN

## 2012-11-29 MED ORDER — MORPHINE SULFATE (PF) 0.5 MG/ML IJ SOLN
INTRAMUSCULAR | Status: DC | PRN
  Administered 2012-11-29: 1 mg via INTRAVENOUS

## 2012-11-29 MED ORDER — MEPERIDINE HCL 25 MG/ML IJ SOLN
INTRAMUSCULAR | Status: AC
  Filled 2012-11-29: qty 1

## 2012-11-29 MED ORDER — SIMETHICONE 80 MG PO CHEW: 80.0000 mg | CHEWABLE_TABLET | ORAL | Status: DC | PRN

## 2012-11-29 MED ORDER — LACTATED RINGERS IV SOLN
INTRAVENOUS | Status: DC | PRN
  Administered 2012-11-29: 19:00:00 via INTRAVENOUS

## 2012-11-29 MED ORDER — OXYTOCIN 40 UNITS IN LACTATED RINGERS INFUSION - SIMPLE MED: 62.5000 mL/h | INTRAVENOUS | Status: AC

## 2012-11-29 MED ORDER — ONDANSETRON HCL 4 MG/2ML IJ SOLN
INTRAMUSCULAR | Status: DC | PRN
  Administered 2012-11-29: 4 mg via INTRAVENOUS

## 2012-11-29 MED ORDER — ONDANSETRON HCL 4 MG/2ML IJ SOLN: 4.0000 mg | INTRAMUSCULAR | Status: DC | PRN

## 2012-11-29 MED ORDER — LIDOCAINE HCL (PF) 1 % IJ SOLN
INTRAMUSCULAR | Status: DC | PRN
  Administered 2012-11-29 (×2): 8 mL

## 2012-11-29 MED ORDER — SCOPOLAMINE 1 MG/3DAYS TD PT72
1.0000 | MEDICATED_PATCH | Freq: Once | TRANSDERMAL | Status: DC
  Administered 2012-11-29: 1.5 mg via TRANSDERMAL

## 2012-11-29 MED ORDER — ONDANSETRON HCL 4 MG/2ML IJ SOLN
INTRAMUSCULAR | Status: AC
  Filled 2012-11-29: qty 2

## 2012-11-29 MED ORDER — SENNOSIDES-DOCUSATE SODIUM 8.6-50 MG PO TABS
2.0000 | ORAL_TABLET | Freq: Every day | ORAL | Status: DC
  Administered 2012-12-01: 2 via ORAL

## 2012-11-29 MED ORDER — LANOLIN HYDROUS EX OINT: 1.0000 "application " | TOPICAL_OINTMENT | CUTANEOUS | Status: DC | PRN

## 2012-11-29 MED ORDER — KETOROLAC TROMETHAMINE 60 MG/2ML IM SOLN
INTRAMUSCULAR | Status: AC
  Filled 2012-11-29: qty 2

## 2012-11-29 MED ORDER — IBUPROFEN 600 MG PO TABS
600.0000 mg | ORAL_TABLET | Freq: Four times a day (QID) | ORAL | Status: DC
  Administered 2012-11-30 – 2012-12-01 (×6): 600 mg via ORAL
  Filled 2012-11-29 (×6): qty 1

## 2012-11-29 MED ORDER — CLINDAMYCIN PHOSPHATE 900 MG/50ML IV SOLN
900.0000 mg | Freq: Three times a day (TID) | INTRAVENOUS | Status: DC
  Administered 2012-11-29: 900 mg via INTRAVENOUS
  Filled 2012-11-29: qty 50

## 2012-11-29 MED ORDER — PROMETHAZINE HCL 25 MG/ML IJ SOLN: 6.2500 mg | INTRAMUSCULAR | Status: DC | PRN

## 2012-11-29 MED ORDER — SODIUM CHLORIDE 0.9 % IJ SOLN: 3.0000 mL | INTRAMUSCULAR | Status: DC | PRN

## 2012-11-29 MED ORDER — WITCH HAZEL-GLYCERIN EX PADS: 1.0000 "application " | MEDICATED_PAD | CUTANEOUS | Status: DC | PRN

## 2012-11-29 MED ORDER — OXYTOCIN 40 UNITS IN LACTATED RINGERS INFUSION - SIMPLE MED: 1.0000 m[IU]/min | INTRAVENOUS | Status: DC

## 2012-11-29 MED ORDER — TERBUTALINE SULFATE 1 MG/ML IJ SOLN: 0.2500 mg | Freq: Once | INTRAMUSCULAR | Status: DC | PRN

## 2012-11-29 MED ORDER — LIDOCAINE-EPINEPHRINE (PF) 2 %-1:200000 IJ SOLN
INTRAMUSCULAR | Status: AC
  Filled 2012-11-29: qty 20

## 2012-11-29 MED ORDER — LACTATED RINGERS IV SOLN
INTRAVENOUS | Status: DC
  Administered 2012-11-30: 08:00:00 via INTRAVENOUS

## 2012-11-29 MED ORDER — KETOROLAC TROMETHAMINE 30 MG/ML IJ SOLN: 30.0000 mg | Freq: Four times a day (QID) | INTRAMUSCULAR | Status: AC | PRN

## 2012-11-29 MED ORDER — ZOLPIDEM TARTRATE 5 MG PO TABS: 5.0000 mg | ORAL_TABLET | Freq: Every evening | ORAL | Status: DC | PRN

## 2012-11-29 MED ORDER — PRENATAL MULTIVITAMIN CH
1.0000 | ORAL_TABLET | Freq: Every day | ORAL | Status: DC
  Administered 2012-11-30 – 2012-12-01 (×2): 1 via ORAL
  Filled 2012-11-29 (×2): qty 1

## 2012-11-29 SURGICAL SUPPLY — 35 items
ADH SKN CLS APL DERMABOND .7 (GAUZE/BANDAGES/DRESSINGS) ×1
ADH SKN CLS LQ APL DERMABOND (GAUZE/BANDAGES/DRESSINGS) ×1
CLOTH BEACON ORANGE TIMEOUT ST (SAFETY) ×2 IMPLANT
DERMABOND ADHESIVE PROPEN (GAUZE/BANDAGES/DRESSINGS) ×1
DERMABOND ADVANCED (GAUZE/BANDAGES/DRESSINGS) ×1
DERMABOND ADVANCED .7 DNX12 (GAUZE/BANDAGES/DRESSINGS) ×1 IMPLANT
DERMABOND ADVANCED .7 DNX6 (GAUZE/BANDAGES/DRESSINGS) IMPLANT
DRAPE LG THREE QUARTER DISP (DRAPES) ×2 IMPLANT
DRSG OPSITE POSTOP 4X10 (GAUZE/BANDAGES/DRESSINGS) ×2 IMPLANT
DURAPREP 26ML APPLICATOR (WOUND CARE) ×2 IMPLANT
ELECT REM PT RETURN 9FT ADLT (ELECTROSURGICAL) ×2
ELECTRODE REM PT RTRN 9FT ADLT (ELECTROSURGICAL) ×1 IMPLANT
EXTRACTOR VACUUM M CUP 4 TUBE (SUCTIONS) IMPLANT
GLOVE BIO SURGEON STRL SZ8.5 (GLOVE) ×2 IMPLANT
GOWN PREVENTION PLUS XXLARGE (GOWN DISPOSABLE) ×2 IMPLANT
GOWN STRL REIN XL XLG (GOWN DISPOSABLE) ×4 IMPLANT
KIT ABG SYR 3ML LUER SLIP (SYRINGE) IMPLANT
NDL HYPO 25X5/8 SAFETYGLIDE (NEEDLE) ×1 IMPLANT
NEEDLE HYPO 25X5/8 SAFETYGLIDE (NEEDLE) ×2 IMPLANT
NS IRRIG 1000ML POUR BTL (IV SOLUTION) ×2 IMPLANT
PACK C SECTION WH (CUSTOM PROCEDURE TRAY) ×2 IMPLANT
PAD OB MATERNITY 4.3X12.25 (PERSONAL CARE ITEMS) ×2 IMPLANT
SLEEVE SCD COMPRESS KNEE MED (MISCELLANEOUS) IMPLANT
SUT CHROMIC 0 CT 802H (SUTURE) ×2 IMPLANT
SUT CHROMIC 1 CTX 36 (SUTURE) ×4 IMPLANT
SUT CHROMIC 2 0 SH (SUTURE) ×2 IMPLANT
SUT GUT PLAIN 0 CT-3 TAN 27 (SUTURE) IMPLANT
SUT MON AB 4-0 PS1 27 (SUTURE) ×2 IMPLANT
SUT VIC AB 0 CT1 18XCR BRD8 (SUTURE) IMPLANT
SUT VIC AB 0 CT1 8-18 (SUTURE)
SUT VIC AB 0 CTX 36 (SUTURE) ×4
SUT VIC AB 0 CTX36XBRD ANBCTRL (SUTURE) ×2 IMPLANT
TOWEL OR 17X24 6PK STRL BLUE (TOWEL DISPOSABLE) ×6 IMPLANT
TRAY FOLEY CATH 14FR (SET/KITS/TRAYS/PACK) ×2 IMPLANT
WATER STERILE IRR 1000ML POUR (IV SOLUTION) ×2 IMPLANT

## 2012-11-29 NOTE — Transfer of Care (Signed)
Immediate Anesthesia Transfer of Care Note  Patient: Mary [name]  Procedure(s) Performed: Procedure(s): Primary cesarean section with delivery of baby girl at 27.  Apgars 8/9. (N/A)  Patient Location: PACU  Anesthesia Type:Epidural  Level of Consciousness: awake  Airway & Oxygen Therapy: Patient Spontanous Breathing  Post-op Assessment: Report given to PACU RN and Post -op Vital signs reviewed and stable  Post vital signs: stable  Complications: No apparent anesthesia complications

## 2012-11-29 NOTE — Anesthesia Postprocedure Evaluation (Signed)
  Anesthesia Post-op Note  Patient: Mary [name]  Procedure(s) Performed: Procedure(s): Primary cesarean section with delivery of baby girl at 15.  Apgars 8/9. (N/A)   Patient is awake, responsive, moving her legs, and has signs of resolution of her numbness. Pain and nausea are reasonably well controlled. Vital signs are stable and clinically acceptable. Oxygen saturation is clinically acceptable. There are no apparent anesthetic complications at this time. Patient is ready for discharge.

## 2012-11-29 NOTE — OR Nursing (Addendum)
Uterus massaged by S. Luma Clopper Charity fundraiser. Two tubes of cord blood sent to lab.   Foley catheter in upon arrival to OR.  Urine color-yellow.  30cc of blood evacuated from uterus during uterine massage.

## 2012-11-29 NOTE — Progress Notes (Signed)
Patient ID: Mary [name], female   DOB: [birth year], 37 y.o.   MRN: [id number] Since 7 AM this a.m. Patient was 5 cm 100% vertex -1 to - she's been in adequate labor all day and is now 10 hours and she has not changed her cervix patient will be delivered by C-section for failure to progress in labor

## 2012-11-29 NOTE — Anesthesia Procedure Notes (Signed)
Epidural Patient location during procedure: OB Start time: 11/29/2012 2:18 PM End time: 11/29/2012 2:22 PM  Staffing Anesthesiologist: Sandrea Hughs Performed by: anesthesiologist   Preanesthetic Checklist Completed: patient identified, site marked, surgical consent, pre-op evaluation, timeout performed, IV checked, risks and benefits discussed and monitors and equipment checked  Epidural Patient position: sitting Prep: site prepped and draped and DuraPrep Patient monitoring: continuous pulse ox and blood pressure Approach: midline Injection technique: LOR air  Needle:  Needle type: Tuohy  Needle gauge: 17 G Needle length: 9 cm and 9 Needle insertion depth: 6 cm Catheter type: closed end flexible Catheter size: 19 Gauge Catheter at skin depth: 11 cm Test dose: negative and Other  Assessment Sensory level: T8 Events: blood not aspirated, injection not painful, no injection resistance, negative IV test and no paresthesia  Additional Notes Reason for block:procedure for pain

## 2012-11-29 NOTE — Anesthesia Preprocedure Evaluation (Signed)
Anesthesia Evaluation  Patient identified by MRN, date of birth, ID band Patient awake    Reviewed: Allergy & Precautions, H&P , NPO status , Patient's Chart, lab work & pertinent test results  Airway Mallampati: II TM Distance: >3 FB Neck ROM: full    Dental no notable dental hx.    Pulmonary asthma ,    Pulmonary exam normal       Cardiovascular negative cardio ROS      Neuro/Psych negative psych ROS   GI/Hepatic negative GI ROS, Neg liver ROS,   Endo/Other  negative endocrine ROS  Renal/GU negative Renal ROS  negative genitourinary   Musculoskeletal negative musculoskeletal ROS (+)   Abdominal Normal abdominal exam  (+)   Peds  Hematology negative hematology ROS (+)   Anesthesia Other Findings   Reproductive/Obstetrics (+) Pregnancy                           Anesthesia Physical Anesthesia Plan  ASA: II  Anesthesia Plan: Epidural   Post-op Pain Management:    Induction:   Airway Management Planned:   Additional Equipment:   Intra-op Plan:   Post-operative Plan:   Informed Consent: I have reviewed the patients History and Physical, chart, labs and discussed the procedure including the risks, benefits and alternatives for the proposed anesthesia with the patient or authorized representative who has indicated his/her understanding and acceptance.     Plan Discussed with:   Anesthesia Plan Comments:         Anesthesia Quick Evaluation

## 2012-11-29 NOTE — Op Note (Signed)
preop diagnosis failure to progress in labor Postop diagnosis is same Anesthesia epidural Surgeon Dr. Francoise Ceo Procedure patient placed on the operating table in the supine position abdomen prepped and draped bladder emptied with a Foley catheter pulse a transverse suprapubic incision made carried down to the rectus fascia fascia cleaned and incised the length of the incision recti muscles retracted laterally peritoneum incised longitudinally transverse suprapubic incision made in the bladder mobilized inferiorly transverse low uterine incision made patient delivered from the OP position of a female Apgar 8 and 9 placenta was posterior fundal removed manually and sent to labor and delivery uterine cavity clean with dry laps uterine incision closed in one layer with continuous looped abnormal one chromic hemostasis satisfactory bladder flap reattached to a chromic uterus well contracted tubes and ovaries normal abdomen closed in layers peritoneum continuous with 2-0 chromic Paul's fascia closed with in interlocking suture normal on chromic skin shows a subcuticular stitch of 4-0 Monocryl blood loss 600 cc patient tolerated the procedure well taken to recovery room in good condition

## 2012-11-29 NOTE — Progress Notes (Signed)
Patient ID: Mary [name], female   DOB: 10/18/1975, 37 y.o.   MRN: [id number] For above was expelled at 9:30 PM last night she is now on 22 milliunits of Pitocin cervix is 5 cm 100% with the vertex at -1-2 station amniotomy performed the fluids clear estimated fetal weight 8 pounds continue labor

## 2012-11-30 LAB — CBC
Hemoglobin: 9.8 g/dL — ABNORMAL LOW (ref 12.0–15.0)
MCH: 28.9 pg (ref 26.0–34.0)
RBC: 3.39 MIL/uL — ABNORMAL LOW (ref 3.87–5.11)
WBC: 22.1 10*3/uL — ABNORMAL HIGH (ref 4.0–10.5)

## 2012-11-30 MED ORDER — INFLUENZA VIRUS VACC SPLIT PF IM SUSP
0.5000 mL | INTRAMUSCULAR | Status: AC
  Administered 2012-12-01: 0.5 mL via INTRAMUSCULAR
  Filled 2012-11-30: qty 0.5

## 2012-11-30 NOTE — Progress Notes (Signed)
Patient ID: Mary [name], female   DOB: 1976/03/27, 36 y.o.   MRN: [id number]  Vital signs normal Incision clean and dry Lochia moderate Legs negative doing wellPostpartum day one 1

## 2012-11-30 NOTE — Anesthesia Postprocedure Evaluation (Signed)
Anesthesia Post Note  Patient: Mary [name]  Procedure(s) Performed: Procedure(s): Primary cesarean section with delivery of baby girl at 85.  Apgars 8/9. (N/A)  Anesthesia type: Epidural  Patient location: Mother/Baby  Post pain: Pain level controlled  Post assessment: Post-op Vital signs reviewed  Last Vitals: BP 118/71  Pulse 75  Temp(Src) 37.1 C (Oral)  Resp 18  Ht 5\' 4"  (1.626 m)  Wt 174 lb (78.926 kg)  BMI 29.85 kg/m2  SpO2 99%  LMP 02/12/2012  Post vital signs: Reviewed  Level of consciousness: awake  Complications: No apparent anesthesia complications

## 2012-11-30 NOTE — Clinical Social Work Note (Signed)
Clinical Social Work Department PSYCHOSOCIAL ASSESSMENT - MATERNAL/CHILD 11/30/2012  Patient:  Mary [name],Mary [name]  Account Number:  [id number]  Admit Date:  11/26/2012  Childs Name:   [name]    Clinical Social Worker:  Kenlei Safi, LCSW   Date/Time:  11/30/2012 12:00 N  Date Referred:  11/30/2012   Referral source  RN  Physician     Referred reason  Substance Abuse   Other referral source:    I:  FAMILY / HOME ENVIRONMENT Child's legal guardian:  PARENT  Guardian - Name Guardian - Age Guardian - Address  Nashira Kent [address] [place], [place] [address]  Ernest Brown     Other household support members/support persons Name Relationship DOB  Keychelle Atkins AUNT    Other support:   MOB repots sister and family are good supports.    II  PSYCHOSOCIAL DATA Information Source:  Patient Interview  Financial and Community Resources Employment:   unemployed   Financial resources:  Medicaid If Medicaid - County:  [place] Other  WIC   School / Grade:   Maternity Care Coordinator / Child Services Coordination / Early Interventions:  Cultural issues impacting care:    III  STRENGTHS Strengths  Adequate Resources  Home prepared for Child (including basic supplies)  Compliance with medical plan  Supportive family/friends   Strength comment:    IV  RISK FACTORS AND CURRENT PROBLEMS Current Problem:  None   Risk Factor & Current Problem Patient Issue Family Issue Risk Factor / Current Problem Comment   N N     V  SOCIAL WORK ASSESSMENT CSW spoke with MOB at bedside.  MOB was holding infant and FOB went to the restroom, MOB reported this was a good time to speak.  CSW discussed any emotional concerns with MOB. MOB reported having no hx of emotional concerns and none during pregnancy.  MOB reports this is her first child and expressed excitement about being a mother.  CSW discussed FOB and family support.  MOB reports currently living with her  sister.  Her sister has children but they are all over 18 and have moved out of the home.  FOB does not live with MOB, however reports he is good support.  MOB reports no concerns with supplies or family support.  CSW discussed insurance.  MOB reports no concerns with medicaid.  CSW discussed hx of MJ use.  MOB reports doing it once during pregnancy, however it is not some she does chronically and has not used since then.  CSW discussed hospital policy to drug screen and UDS results, as well as waiting for MEC. MOB expressed understanding and reports not being concerned about results from MEC.  Please reconsult CSW if further needs arise.      VI SOCIAL WORK PLAN Social Work Plan  No Further Intervention Required / No Barriers to Discharge   Type of pt/family education:   If child protective services report - county:   If child protective services report - date:   Information/referral to community resources comment:   Other social work plan:    

## 2012-12-01 ENCOUNTER — Encounter (HOSPITAL_COMMUNITY): Payer: Self-pay | Admitting: Obstetrics

## 2012-12-01 MED ORDER — PNEUMOCOCCAL VAC POLYVALENT 25 MCG/0.5ML IJ INJ
0.5000 mL | INJECTION | INTRAMUSCULAR | Status: AC
  Administered 2012-12-01: 0.5 mL via INTRAMUSCULAR
  Filled 2012-12-01: qty 0.5

## 2012-12-01 MED ORDER — MEASLES, MUMPS & RUBELLA VAC ~~LOC~~ INJ
0.5000 mL | INJECTION | Freq: Once | SUBCUTANEOUS | Status: AC
  Administered 2012-12-01: 0.5 mL via SUBCUTANEOUS
  Filled 2012-12-01 (×2): qty 0.5

## 2012-12-01 MED ORDER — HYDROCODONE-ACETAMINOPHEN 5-500 MG PO TABS: 1.0000 | ORAL_TABLET | Freq: Four times a day (QID) | ORAL | Status: DC | PRN

## 2012-12-01 NOTE — Progress Notes (Signed)
intial assessment

## 2012-12-01 NOTE — Discharge Summary (Signed)
  Obstetric Discharge Summary Reason for Admission: induction of labor Prenatal Procedures: none Intrapartum Procedures: cesarean: low cervical, transverse Postpartum Procedures: none Complications-Operative and Postpartum: none  Hemoglobin  Date Value Range Status  11/30/2012 9.8* 12.0 - 15.0 g/dL Final     DELTA CHECK NOTED     REPEATED TO VERIFY     HCT  Date Value Range Status  11/30/2012 29.1* 36.0 - 46.0 % Final    Physical Exam:  General: alert Lochia: appropriate Uterine: firm Incision: clean and dry DVT Evaluation: No evidence of DVT seen on physical exam.  Discharge Diagnoses: Active Problems:   Prolonged pregnancy, 41 weeks   Cesarean delivery delivered   Discharge Information: Date: 12/01/2012 Activity: pelvic rest Diet: routine Medications:  Prior to Admission medications   Medication Sig Start Date End Date Taking? Authorizing Provider  albuterol (PROVENTIL HFA;VENTOLIN HFA) 108 (90 BASE) MCG/ACT inhaler Inhale 2 puffs into the lungs every 6 (six) hours as needed. Shortness of breath   Yes Historical Provider, MD  dextromethorphan-guaiFENesin (MUCINEX DM) 30-600 MG per 12 hr tablet Take 1 tablet by mouth every 12 (twelve) hours.   Yes Historical Provider, MD  Prenatal Vit-Fe Fumarate-FA (PRENATAL MULTIVITAMIN) TABS Take 1 tablet by mouth daily at 12 noon.   Yes Historical Provider, MD  HYDROcodone-acetaminophen (VICODIN) 5-500 MG per tablet Take 1 tablet by mouth every 6 (six) hours as needed for pain. 12/01/12   Antionette Char, MD    Condition: stable Instructions: refer to routine discharge instructions Discharge to: home Follow-up Information   Follow up with HARPER,CHARLES A, MD. Schedule an appointment as soon as possible for a visit in 2 weeks.   Contact information:   [address] [place] [address] [phone]       Newborn Data: Live born  Information for the patient's newborn:  Synethia, [place] [[id number]]   female ; APGAR (1 MIN): 8   APGAR (5 MINS): 9     Home with mother.  JACKSON-MOORE,Macrae Wiegman A 12/01/2012, 2:01 PM

## 2012-12-01 NOTE — Progress Notes (Signed)
Patient ID: Mary [name], female   DOB: 1976/07/24, 37 y.o.   MRN: [id number] Subjective: POD# 2 s/p Cesarean Delivery.  Indications: failure to progress  RH status/Rubella reviewed. Feeding: breast Patient reports tolerating PO.  Denies HA/SOB/C/P/N/V/dizziness.   Breast symptoms: no.  She reports vaginal bleeding as normal, without clots.  She is ambulating, urinating without difficulty.     Objective: Vital signs in last 24 hours: BP 101/66  Pulse 70  Temp(Src) 98.1 F (36.7 C) (Oral)  Resp 18  Ht 5\' 4"  (1.626 m)  Wt 78.926 kg (174 lb)  BMI 29.85 kg/m2  SpO2 98%  LMP 02/12/2012       Physical Exam:  General: alert CV: Regular rate and rhythm Resp: clear Abdomen: soft, nontender, normal bowel sounds Lochia: minimal Uterine Fundus: firm, below umbilicus, nontender Incision: clean and dry Ext: extremities normal, atraumatic, no cyanosis or edema    Recent Labs  11/29/12 1112 11/30/12 0636  HGB 12.0 9.8*  HCT 35.4* 29.1*      Assessment/Plan: 37 y.o.  status post Cesarean section. POD# 2.   Doing well, stable.                Ambulate IS Routine post-op care  JACKSON-MOORE,Thurmond Hildebran A 12/01/2012, 8:13 AM

## 2012-12-01 NOTE — Progress Notes (Signed)
UR completed 

## 2012-12-08 ENCOUNTER — Encounter: Payer: Self-pay | Admitting: Obstetrics

## 2013-01-20 ENCOUNTER — Ambulatory Visit: Payer: Medicaid Other | Admitting: Obstetrics

## 2014-07-05 ENCOUNTER — Encounter (HOSPITAL_COMMUNITY): Payer: Self-pay | Admitting: Obstetrics

## 2015-01-16 ENCOUNTER — Encounter (HOSPITAL_COMMUNITY): Payer: Self-pay | Admitting: Adult Health

## 2015-01-16 ENCOUNTER — Emergency Department (HOSPITAL_COMMUNITY)
Admission: EM | Admit: 2015-01-16 | Discharge: 2015-01-16 | Disposition: A | Discharge status: Home / Self Care | Payer: Medicaid Other | Attending: Emergency Medicine | Admitting: Emergency Medicine

## 2015-01-16 DIAGNOSIS — Z880 Allergy status to penicillin: Secondary | ICD-10-CM | POA: Diagnosis not present

## 2015-01-16 DIAGNOSIS — Z87440 Personal history of urinary (tract) infections: Secondary | ICD-10-CM | POA: Diagnosis not present

## 2015-01-16 DIAGNOSIS — K0889 Other specified disorders of teeth and supporting structures: Secondary | ICD-10-CM

## 2015-01-16 DIAGNOSIS — Z720 Tobacco use: Secondary | ICD-10-CM | POA: Diagnosis not present

## 2015-01-16 DIAGNOSIS — Z79899 Other long term (current) drug therapy: Secondary | ICD-10-CM | POA: Diagnosis not present

## 2015-01-16 DIAGNOSIS — J45909 Unspecified asthma, uncomplicated: Secondary | ICD-10-CM | POA: Diagnosis not present

## 2015-01-16 DIAGNOSIS — K088 Other specified disorders of teeth and supporting structures: Secondary | ICD-10-CM | POA: Insufficient documentation

## 2015-01-16 DIAGNOSIS — K029 Dental caries, unspecified: Secondary | ICD-10-CM | POA: Diagnosis not present

## 2015-01-16 MED ORDER — TRAMADOL HCL 50 MG PO TABS: 50.0000 mg | ORAL_TABLET | Freq: Four times a day (QID) | ORAL | Status: DC | PRN

## 2015-01-16 MED ORDER — KETOROLAC TROMETHAMINE 60 MG/2ML IM SOLN
60.0000 mg | Freq: Once | INTRAMUSCULAR | Status: AC
  Administered 2015-01-16: 60 mg via INTRAMUSCULAR
  Filled 2015-01-16: qty 2

## 2015-01-16 MED ORDER — OXYCODONE-ACETAMINOPHEN 5-325 MG PO TABS
1.0000 | ORAL_TABLET | Freq: Once | ORAL | Status: AC
  Administered 2015-01-16: 1 via ORAL
  Filled 2015-01-16: qty 1

## 2015-01-16 MED ORDER — IBUPROFEN 600 MG PO TABS: 600.0000 mg | ORAL_TABLET | Freq: Four times a day (QID) | ORAL | Status: DC | PRN

## 2015-01-16 MED ORDER — CLINDAMYCIN HCL 150 MG PO CAPS: 300.0000 mg | ORAL_CAPSULE | Freq: Three times a day (TID) | ORAL | Status: DC

## 2015-01-16 NOTE — Discharge Instructions (Signed)
Take ibuprofen for pain. Tramadol for severe pain. Take clindamycin as prescribed until all gone for possible infection. Please follow-up with a dentist.   Dental Pain A tooth ache may be caused by cavities (tooth decay). Cavities expose the nerve of the tooth to air and hot or cold temperatures. It may come from an infection or abscess (also called a boil or furuncle) around your tooth. It is also often caused by dental caries (tooth decay). This causes the pain you are having. DIAGNOSIS  Your caregiver can diagnose this problem by exam. TREATMENT   If caused by an infection, it may be treated with medications which kill germs (antibiotics) and pain medications as prescribed by your caregiver. Take medications as directed.  Only take over-the-counter or prescription medicines for pain, discomfort, or fever as directed by your caregiver.  Whether the tooth ache today is caused by infection or dental disease, you should see your dentist as soon as possible for further care. SEEK MEDICAL CARE IF: The exam and treatment you received today has been provided on an emergency basis only. This is not a substitute for complete medical or dental care. If your problem worsens or new problems (symptoms) appear, and you are unable to meet with your dentist, call or return to this location. SEEK IMMEDIATE MEDICAL CARE IF:   You have a fever.  You develop redness and swelling of your face, jaw, or neck.  You are unable to open your mouth.  You have severe pain uncontrolled by pain medicine. MAKE SURE YOU:   Understand these instructions.  Will watch your condition.  Will get help right away if you are not doing well or get worse. Document Released: 08/20/2005 Document Revised: 11/12/2011 Document Reviewed: 04/07/2008 [facility] Patient Information 2015 [employer], [place]. This information is not intended to replace advice given to you by your health care provider. Make sure you discuss any questions you  have with your health care provider.

## 2015-01-16 NOTE — ED Notes (Signed)
Presents with right sided tooth upper back tooth pain-began one hour ago-pain is 10/10

## 2015-01-16 NOTE — ED Provider Notes (Signed)
CSN: [id number]     Arrival date & time 01/16/15  0018 History   First MD Initiated Contact with Patient 01/16/15 [phone]     Chief Complaint  Patient presents with  . Dental Pain     (Consider location/radiation/quality/duration/timing/severity/associated sxs/prior Treatment) HPI Mary [name] is a 39 y.o. female presents to ED with complaint of a dental pain. Pt states her pain in the right upper tooth. States started 2 hours ago. States pain is 10/10. Reports unable to sleep. Also reports area of swelling to the right lower jaw that has been there for several days. States it is tender. She denies any fever or chills. Denies any dental injuries. No swelling under the tongue. No difficulties swallowing.  No other complaints.   Past Medical History  Diagnosis Date  . Asthma   . Urinary tract infection   . JXBJYNWG(956.2Headache(784.0)    Past Surgical History  Procedure Laterality Date  . No past surgeries    . Multiple tooth extractions  08/2012    x 6 teeth  . Cesarean section N/A 11/29/2012    Procedure: Primary cesarean section with delivery of baby girl at 1840.  Apgars 8/9.;  Surgeon: Kathreen CosierBernard A Marshall, MD;  Location: [facility];  Service: Obstetrics;  Laterality: N/A;   Family History  Problem Relation Age of Onset  . Other Neg Hx   . Alcohol abuse Neg Hx   . Birth defects Neg Hx   . Cancer Neg Hx   . COPD Neg Hx   . Stroke Neg Hx   . Vision loss Neg Hx   . Miscarriages / Stillbirths Neg Hx   . Mental retardation Neg Hx   . Mental illness Neg Hx   . Learning disabilities Neg Hx   . Kidney disease Neg Hx   . Heart disease Neg Hx   . Hearing loss Neg Hx   . Early death Neg Hx   . Arthritis Mother   . Depression Mother   . Hypertension Mother   . Drug abuse Mother   . Asthma Maternal Aunt   . Hyperlipidemia Maternal Aunt   . Diabetes Maternal Grandmother    History  Substance Use Topics  . Smoking status: Current Every Day Smoker -- 0.50 packs/day for 10 years    Types:  Cigarettes  . Smokeless tobacco: Never Used  . Alcohol Use: No   OB History    Gravida Para Term Preterm AB TAB SAB Ectopic Multiple Living   2 1 1  1  1   1      Review of Systems  Constitutional: Negative for fever and chills.  HENT: Positive for dental problem and facial swelling. Negative for ear pain, sore throat and trouble swallowing.   Respiratory: Negative for cough, chest tightness and shortness of breath.   Cardiovascular: Negative for chest pain, palpitations and leg swelling.  Gastrointestinal: Negative for vomiting.  Skin: Negative for rash.  Neurological: Positive for headaches. Negative for dizziness and weakness.  All other systems reviewed and are negative.     Allergies  Shellfish allergy and Penicillins  Home Medications   Prior to Admission medications   Medication Sig Start Date End Date Taking? Authorizing Provider  albuterol (PROVENTIL HFA;VENTOLIN HFA) 108 (90 BASE) MCG/ACT inhaler Inhale 2 puffs into the lungs every 6 (six) hours as needed. Shortness of breath    Historical Provider, MD  dextromethorphan-guaiFENesin (MUCINEX DM) 30-600 MG per 12 hr tablet Take 1 tablet by mouth every 12 (twelve) hours.  Historical Provider, MD  HYDROcodone-acetaminophen (VICODIN) 5-500 MG per tablet Take 1 tablet by mouth every 6 (six) hours as needed for pain. 12/01/12   Antionette CharLisa Jackson-Moore, MD  Prenatal Vit-Fe Fumarate-FA (PRENATAL MULTIVITAMIN) TABS Take 1 tablet by mouth daily at 12 noon.    Historical Provider, MD   BP 123/76 mmHg  Pulse 79  Temp(Src) 98.9 F (37.2 C) (Oral)  Resp 18  SpO2 97%  LMP  (Exact Date) Physical Exam  Constitutional: She is oriented to person, place, and time. She appears well-developed and well-nourished. No distress.  HENT:  Head: Normocephalic.  Right Ear: External ear normal.  Left Ear: External ear normal.  Nose: Nose normal.  Mouth/Throat: Oropharynx is clear and moist.  Dental carries of the right upper 2nd molar. Ttp. No  surrounding gum swelling. Dental carries in right lower 2nd molar. There is palpable, mobile mass/cyst palpated over right mandible, possible abscess. No swelling under the tongue.   Eyes: Conjunctivae are normal.  Neck: Normal range of motion. Neck supple.  Neurological: She is alert and oriented to person, place, and time.  Skin: Skin is warm and dry.  Nursing note and vitals reviewed.   ED Course  Procedures (including critical care time) Labs Review Labs Reviewed - No data to display  Imaging Review No results found.   EKG Interpretation None      MDM   Final diagnoses:  Pain, dental    Pt with dental pain, mild facial swelling. Possible dental abscess. Pt requesting pain meds in ED.  Will give a persocet and toradol IM. Home with clindamycin, ibuprofen, tramadol. Follow-up with a dentist. Patient is otherwise nontoxic appearing. No evidence of Ludwig's angina. She is afebrile. Stable for discharge.  Filed Vitals:   01/16/15 0023  BP: 123/76  Pulse: 79  Temp: 98.9 F (37.2 C)  TempSrc: Oral  Resp: 18  SpO2: 97%       Mary Crumbleatyana Zayleigh Stroh, PA-C 01/16/15 0200  Shon Batonourtney F Horton, MD 01/16/15 [phone]

## 2015-05-25 ENCOUNTER — Ambulatory Visit (INDEPENDENT_AMBULATORY_CARE_PROVIDER_SITE_OTHER): Payer: Medicaid Other | Admitting: Obstetrics

## 2015-05-25 ENCOUNTER — Encounter: Payer: Self-pay | Admitting: Obstetrics

## 2015-05-25 VITALS — BP 111/76 | HR 69 | Temp 98.6°F | Ht 64.0 in | Wt 142.8 lb

## 2015-05-25 DIAGNOSIS — Z0000 Encounter for general adult medical examination without abnormal findings: Secondary | ICD-10-CM | POA: Diagnosis not present

## 2015-05-25 DIAGNOSIS — Z01419 Encounter for gynecological examination (general) (routine) without abnormal findings: Secondary | ICD-10-CM

## 2015-05-25 DIAGNOSIS — T7840XD Allergy, unspecified, subsequent encounter: Secondary | ICD-10-CM

## 2015-05-25 DIAGNOSIS — F17200 Nicotine dependence, unspecified, uncomplicated: Secondary | ICD-10-CM

## 2015-05-25 DIAGNOSIS — D509 Iron deficiency anemia, unspecified: Secondary | ICD-10-CM

## 2015-05-25 DIAGNOSIS — Z3009 Encounter for other general counseling and advice on contraception: Secondary | ICD-10-CM

## 2015-05-25 DIAGNOSIS — J4520 Mild intermittent asthma, uncomplicated: Secondary | ICD-10-CM

## 2015-05-25 MED ORDER — ALBUTEROL SULFATE HFA 108 (90 BASE) MCG/ACT IN AERS: 2.0000 | INHALATION_SPRAY | Freq: Four times a day (QID) | RESPIRATORY_TRACT | Status: DC | PRN

## 2015-05-25 MED ORDER — VITAFOL FE+ 90-1-200 & 50 MG PO CPPK
1.0000 | ORAL_CAPSULE | Freq: Every day | ORAL | Status: DC
Start: 2015-05-25 — End: 2017-02-06

## 2015-05-25 MED ORDER — VARENICLINE TARTRATE 0.5 MG X 11 & 1 MG X 42 PO MISC: ORAL | Status: DC

## 2015-05-25 MED ORDER — EPINEPHRINE 0.3 MG/0.3ML IJ SOAJ: 0.3000 mg | Freq: Once | INTRAMUSCULAR | Status: DC

## 2015-05-25 NOTE — Progress Notes (Signed)
Subjective:        Mary [name] is a 39 y.o. female here for a routine exam.  Current complaints: None.  Wants to quit smoking.   Personal health questionnaire:  Is patient Ashkenazi Jewish, have a family history of breast and/or ovarian cancer: no Is there a family history of uterine cancer diagnosed at age < 26, gastrointestinal cancer, urinary tract cancer, family member who is a Personnel officer syndrome-associated carrier: no Is the patient overweight and hypertensive, family history of diabetes, personal history of gestational diabetes, preeclampsia or PCOS: no Is patient over 82, have PCOS,  family history of premature CHD under age 4, diabetes, smoke, have hypertension or peripheral artery disease:  no At any time, has a partner hit, kicked or otherwise hurt or frightened you?: no Over the past 2 weeks, have you felt down, depressed or hopeless?: no Over the past 2 weeks, have you felt little interest or pleasure in doing things?:no   Gynecologic History Patient's last menstrual period was 05/16/2015. Contraception: condoms Last Pap: Unknown. Results were: normal Last mammogram: n/a. Results were: n/a  Obstetric History OB History  Gravida Para Term Preterm AB SAB TAB Ectopic Multiple Living  # Outcome Date GA Lbr Len/2nd Weight Sex Delivery Anes PTL Lv  2 Term 11/29/12 [redacted]w[redacted]d  6 lb 10.1 oz (3.008 kg) F CS-LTranv EPI  Y  1 SAB               Past Medical History  Diagnosis Date  . Asthma   . Urinary tract infection   . ZOXWRUEA(540.9)     Past Surgical History  Procedure Laterality Date  . No past surgeries    . Multiple tooth extractions  08/2012    x 6 teeth  . Cesarean section N/A 11/29/2012    Procedure: Primary cesarean section with delivery of baby girl at 1840.  Apgars 8/9.;  Surgeon: Kathreen Cosier, MD;  Location: [facility];  Service: Obstetrics;  Laterality: N/A;     Current outpatient prescriptions:  .  albuterol (PROVENTIL  HFA;VENTOLIN HFA) 108 (90 BASE) MCG/ACT inhaler, Inhale 2 puffs into the lungs every 6 (six) hours as needed. Shortness of breath, Disp: , Rfl:  .  traMADol (ULTRAM) 50 MG tablet, Take 1 tablet (50 mg total) by mouth every 6 (six) hours as needed. (Patient not taking: Reported on 05/25/2015), Disp: 15 tablet, Rfl: 0 Allergies  Allergen Reactions  . Bee Venom   . Shellfish Allergy Hives  . Penicillins Rash    Social History  Substance Use Topics  . Smoking status: Current Every Day Smoker -- 0.50 packs/day for 10 years    Types: Cigarettes  . Smokeless tobacco: Never Used  . Alcohol Use: 0.0 oz/week    0 Standard drinks or equivalent per week    Family History  Problem Relation Age of Onset  . Other Neg Hx   . Alcohol abuse Neg Hx   . Birth defects Neg Hx   . Cancer Neg Hx   . COPD Neg Hx   . Stroke Neg Hx   . Vision loss Neg Hx   . Miscarriages / Stillbirths Neg Hx   . Mental retardation Neg Hx   . Mental illness Neg Hx   . Learning disabilities Neg Hx   . Kidney disease Neg Hx   . Heart disease Neg Hx   . Hearing loss Neg Hx   .  Early death Neg Hx   . Arthritis Mother   . Depression Mother   . Hypertension Mother   . Drug abuse Mother   . Asthma Maternal Aunt   . Hyperlipidemia Maternal Aunt   . Diabetes Maternal Grandmother       Review of Systems  Constitutional: negative for fatigue and weight loss Respiratory: negative for cough and wheezing Cardiovascular: negative for chest pain, fatigue and palpitations Gastrointestinal: negative for abdominal pain and change in bowel habits Musculoskeletal:negative for myalgias Neurological: negative for gait problems and tremors Behavioral/Psych: negative for abusive relationship, depression Endocrine: negative for temperature intolerance   Genitourinary:negative for abnormal menstrual periods, genital lesions, hot flashes, sexual problems and vaginal discharge Integument/breast: negative for breast lump, breast  tenderness, nipple discharge and skin lesion(s)    Objective:       BP 111/76 mmHg  Pulse 69  Temp(Src) 98.6 F (37 C)  Ht  (1.626 m)  Wt 142 lb 12.8 oz (64.774 kg)  BMI 24.50 kg/m2  LMP 05/16/2015 General:   alert  Skin:   no rash or abnormalities  Lungs:   clear to auscultation bilaterally  Heart:   regular rate and rhythm, S1, S2 normal, no murmur, click, rub or gallop  Breasts:   normal without suspicious masses, skin or nipple changes or axillary nodes  Abdomen:  normal findings: no organomegaly, soft, non-tender and no hernia  Pelvis:  External genitalia: normal general appearance Urinary system: urethral meatus normal and bladder without fullness, nontender Vaginal: normal without tenderness, induration or masses Cervix: normal appearance Adnexa: normal bimanual exam Uterus: anteverted and non-tender, normal size   Lab Review Urine pregnancy test Labs reviewed yes Radiologic studies reviewed no    Assessment:    Healthy female exam.    Contraceptive management.  Wants Nexplano.  Tobacco dependence.  Wants Chantix.   Plan:   Nexplanon Rx Chantix Rx   Education reviewed: calcium supplements, low fat, low cholesterol diet, safe sex/STD prevention, self breast exams, smoking cessation and weight bearing exercise. Contraception: Considering Nexplanon. Follow up in: a few weeks.   On period for Nexplanon.  No orders of the defined types were placed in this encounter.   No orders of the defined types were placed in this encounter.

## 2015-05-26 ENCOUNTER — Telehealth: Payer: Self-pay | Admitting: *Deleted

## 2015-05-26 NOTE — Telephone Encounter (Signed)
I sent the starter pak of Chantix with 2 refills. I have no other options for Epi-pen.

## 2015-05-26 NOTE — Telephone Encounter (Signed)
Patient is calling to let Dr Clearance Coots know that [facility] does not cover the Epipen - she needs an alternative( may have to call vial and syringe). Patient also states that Dr Clearance Coots needs to send continuation Rx for the Chantix- he only sent the starter pack. Told patient I would let him know so that he could take care of that.

## 2015-05-27 LAB — PAP, TP IMAGING W/ HPV RNA, RFLX HPV TYPE 16,18/45: HPV mRNA, High Risk: NOT DETECTED

## 2015-05-29 LAB — SURESWAB, VAGINOSIS/VAGINITIS PLUS
Atopobium vaginae: 7.1 Log (cells/mL)
BV CATEGORY: UNDETERMINED — AB
C. GLABRATA, DNA: NOT DETECTED
C. TROPICALIS, DNA: NOT DETECTED
C. albicans, DNA: NOT DETECTED
C. parapsilosis, DNA: NOT DETECTED
C. trachomatis RNA, TMA: NOT DETECTED
GARDNERELLA VAGINALIS: 7.8 Log (cells/mL)
LACTOBACILLUS SPECIES: 6.3 Log (cells/mL)
MEGASPHAERA SPECIES: >8 Log (cells/mL)
N. GONORRHOEAE RNA, TMA: NOT DETECTED
T. vaginalis RNA, QL TMA: DETECTED — AB

## 2015-05-30 ENCOUNTER — Other Ambulatory Visit: Payer: Self-pay | Admitting: Obstetrics

## 2015-05-30 MED ORDER — TINIDAZOLE 500 MG PO TABS: 2.0000 g | ORAL_TABLET | Freq: Every day | ORAL | Status: DC

## 2015-06-03 ENCOUNTER — Other Ambulatory Visit: Payer: Self-pay | Admitting: *Deleted

## 2015-06-03 DIAGNOSIS — A599 Trichomoniasis, unspecified: Secondary | ICD-10-CM

## 2015-06-03 DIAGNOSIS — B9689 Other specified bacterial agents as the cause of diseases classified elsewhere: Secondary | ICD-10-CM

## 2015-06-03 DIAGNOSIS — N760 Acute vaginitis: Secondary | ICD-10-CM

## 2015-06-03 MED ORDER — METRONIDAZOLE 500 MG PO TABS: 2000.0000 mg | ORAL_TABLET | Freq: Once | ORAL | Status: DC

## 2015-06-03 MED ORDER — METRONIDAZOLE 0.75 % VA GEL: 1.0000 | Freq: Every day | VAGINAL | Status: DC

## 2015-06-03 NOTE — Telephone Encounter (Signed)
Patient notified- she will check on line for helps

## 2015-06-09 ENCOUNTER — Telehealth: Payer: Self-pay | Admitting: *Deleted

## 2015-06-09 NOTE — Telephone Encounter (Signed)
Patient interested in the Nexplanon for contraception. Per Dr. Clearance Coots okay to schedule 3 weeks from her last appointment. Patient is sexually active and last intercourse was 2 days ago. Patient states she is using condoms. Patient advised to make sure she does not have unprotected intercourse before her procedure. Patient verbalized understanding. Patient states she is due for her cycle next week. Patient scheduled for 06-15-15 @ 10:45 am.

## 2015-06-15 ENCOUNTER — Ambulatory Visit: Payer: Medicaid Other | Admitting: Obstetrics

## 2015-06-17 ENCOUNTER — Ambulatory Visit: Payer: Medicaid Other | Admitting: Obstetrics

## 2015-11-02 ENCOUNTER — Emergency Department (HOSPITAL_COMMUNITY)
Admission: EM | Admit: 2015-11-02 | Discharge: 2015-11-02 | Disposition: A | Discharge status: Home / Self Care | Payer: Medicaid Other | Attending: Emergency Medicine | Admitting: Emergency Medicine

## 2015-11-02 ENCOUNTER — Encounter (HOSPITAL_COMMUNITY): Payer: Self-pay | Admitting: *Deleted

## 2015-11-02 DIAGNOSIS — Z880 Allergy status to penicillin: Secondary | ICD-10-CM | POA: Diagnosis not present

## 2015-11-02 DIAGNOSIS — F17210 Nicotine dependence, cigarettes, uncomplicated: Secondary | ICD-10-CM | POA: Diagnosis not present

## 2015-11-02 DIAGNOSIS — M549 Dorsalgia, unspecified: Secondary | ICD-10-CM | POA: Diagnosis present

## 2015-11-02 DIAGNOSIS — Z792 Long term (current) use of antibiotics: Secondary | ICD-10-CM | POA: Diagnosis not present

## 2015-11-02 DIAGNOSIS — M542 Cervicalgia: Secondary | ICD-10-CM | POA: Diagnosis not present

## 2015-11-02 DIAGNOSIS — Z87440 Personal history of urinary (tract) infections: Secondary | ICD-10-CM | POA: Insufficient documentation

## 2015-11-02 DIAGNOSIS — M545 Low back pain, unspecified: Secondary | ICD-10-CM

## 2015-11-02 DIAGNOSIS — J45909 Unspecified asthma, uncomplicated: Secondary | ICD-10-CM | POA: Diagnosis not present

## 2015-11-02 DIAGNOSIS — M62838 Other muscle spasm: Secondary | ICD-10-CM | POA: Diagnosis not present

## 2015-11-02 DIAGNOSIS — Z79899 Other long term (current) drug therapy: Secondary | ICD-10-CM | POA: Diagnosis not present

## 2015-11-02 MED ORDER — METHOCARBAMOL 500 MG PO TABS
500.0000 mg | ORAL_TABLET | Freq: Once | ORAL | Status: AC
  Administered 2015-11-02: 500 mg via ORAL
  Filled 2015-11-02: qty 1

## 2015-11-02 MED ORDER — KETOROLAC TROMETHAMINE 60 MG/2ML IM SOLN
60.0000 mg | Freq: Once | INTRAMUSCULAR | Status: AC
  Administered 2015-11-02: 60 mg via INTRAMUSCULAR
  Filled 2015-11-02: qty 2

## 2015-11-02 MED ORDER — METHOCARBAMOL 500 MG PO TABS: 500.0000 mg | ORAL_TABLET | Freq: Two times a day (BID) | ORAL | Status: DC

## 2015-11-02 NOTE — ED Provider Notes (Signed)
CSN: [id number]     Arrival date & time 11/02/15  1559 History  By signing my name below, I, Evon Slack, attest that this documentation has been prepared under the direction and in the presence of Damarea Merkel, PA-C. Electronically Signed: Evon Slack, ED Scribe. 11/02/2015. 4:52 PM.   Chief Complaint  Patient presents with  . Back Pain    The history is provided by the patient. No language interpreter was used.   HPI Comments: Mary [name] is a 40 y.o. female who presents to the Emergency Department complaining of sharp neck pain onset 3 days prior. Pt states that the pain begins in her neck and radiates towards her right shoulder. She reports developing back pain yesterday which is similar in character to the neck pain but more intense. The neck pain is exacerbated by rotating her head. Denies radiation of the pain into her arms. The back pain is exacerbated by sitting up straight and laying on her right side. The back pain does not radiate into the lower extremities. Pt states that she may have injured her neck or back at work. 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 She states that she does a lot of bending over while at work. Pt denies any pain medications PTA. Pt denies numbness, weakness, bowel/bladder incontinence, abdominal pain, nausea, vomiting, fever or rash. She has no other complaints today.   Past Medical History  Diagnosis Date  . Asthma   . Urinary tract infection   . [id number](638.7)    Past Surgical History  Procedure Laterality Date  . No past surgeries    . Multiple tooth extractions  08/2012    x 6 teeth  . Cesarean section N/A 11/29/2012    Procedure: Primary cesarean section with delivery of baby girl at 1840.  Apgars 8/9.;  Surgeon: Kathreen Cosier, MD;  Location: [facility];  Service: Obstetrics;  Laterality: N/A;   Family History  Problem Relation Age of Onset  . Other Neg Hx   . Alcohol abuse Neg  Hx   . Birth defects Neg Hx   . Cancer Neg Hx   . COPD Neg Hx   . Stroke Neg Hx   . Vision loss Neg Hx   . Miscarriages / Stillbirths Neg Hx   . Mental retardation Neg Hx   . Mental illness Neg Hx   . Learning disabilities Neg Hx   . Kidney disease Neg Hx   . Heart disease Neg Hx   . Hearing loss Neg Hx   . Early death Neg Hx   . Arthritis Mother   . Depression Mother   . Hypertension Mother   . Drug abuse Mother   . Asthma Maternal Aunt   . Hyperlipidemia Maternal Aunt   . Diabetes Maternal Grandmother    Social History  Substance Use Topics  . Smoking status: Current Every Day Smoker -- 0.50 packs/day for 10 years    Types: Cigarettes  . Smokeless tobacco: Never Used  . Alcohol Use: 0.0 oz/week    0 Standard drinks or equivalent per week   OB History    Gravida Para Term Preterm AB TAB SAB Ectopic Multiple Living   Review of Systems  Constitutional: Negative for fever.  Gastrointestinal: Negative for nausea, vomiting and abdominal pain.  Musculoskeletal: Positive for back pain and neck pain.  Skin: Negative for  rash.  Neurological: Negative for weakness and numbness.      Allergies  Bee venom; Shellfish allergy; and Penicillins  Home Medications   Prior to Admission medications   Medication Sig Start Date End Date Taking? Authorizing Provider  albuterol (PROVENTIL HFA;VENTOLIN HFA) 108 (90 BASE) MCG/ACT inhaler Inhale 2 puffs into the lungs every 6 (six) hours as needed for wheezing or shortness of breath. Shortness of breath 05/25/15   Brock Bad, MD  EPINEPHrine 0.3 mg/0.3 mL IJ SOAJ injection Inject 0.3 mLs (0.3 mg total) into the muscle once. 05/25/15   Brock Bad, MD  methocarbamol (ROBAXIN) 500 MG tablet Take 1 tablet (500 mg total) by mouth 2 (two) times daily. 11/02/15   Kendyn Zaman, PA-C  metroNIDAZOLE (FLAGYL) 500 MG tablet Take 4 tablets (2,000 mg total) by mouth once. 06/03/15   Brock Bad, MD  metroNIDAZOLE  (METROGEL VAGINAL) 0.75 % vaginal gel Place 1 Applicatorful vaginally at bedtime. 06/03/15   Brock Bad, MD  Prenat-FePoly-Metf-FA-DHA-DSS (VITAFOL FE+) 90-1-200 & 50 MG CPPK Take 1 capsule by mouth daily before breakfast. 05/25/15   Brock Bad, MD  traMADol (ULTRAM) 50 MG tablet Take 1 tablet (50 mg total) by mouth every 6 (six) hours as needed. Patient not taking: Reported on 05/25/2015 01/16/15   Jaynie Crumble, PA-C  varenicline (CHANTIX STARTING MONTH PAK) 0.5 MG X 11 & 1 MG X 42 tablet Take one 0.5 mg tablet by mouth once daily for 3 days, then increase to one 0.5 mg tablet twice daily for 4 days, then increase to one 1 mg tablet twice daily. 05/25/15   Brock Bad, MD   BP 107/65 mmHg  Pulse 66  Temp(Src) 98.5 F (36.9 C) (Oral)  Resp 16  Ht  (1.626 m)  Wt 63.504 kg  BMI 24.02 kg/m2  SpO2 100%   Physical Exam  Constitutional: She appears well-developed and well-nourished. No distress.  HENT:  Head: Normocephalic and atraumatic.  Right Ear: External ear normal.  Left Ear: External ear normal.  Eyes: Conjunctivae are normal. Right eye exhibits no discharge. Left eye exhibits no discharge. No scleral icterus.  Neck: Normal range of motion. Neck supple. Muscular tenderness present. No spinous process tenderness present. No rigidity. No erythema and normal range of motion present.    Tenderness over the right paraspinous musculature. Spasm present. No focal tenderness over cervical spine. FROM of neck intact with pain on rotation to the right. FROM of the BUE.   Cardiovascular: Normal rate and intact distal pulses.   Pulmonary/Chest: Effort normal.  Musculoskeletal: Normal range of motion.       Lumbar back: She exhibits tenderness. She exhibits normal range of motion, no deformity and no spasm.       Back:  Generalized tenderness over lumbar region. No focal pain over lumbar spine. No bony deformities or step offs. FROM of the thoracic and lumbar spine. FROM  of the BLE. Negative straight leg raise.   Neurological: She is alert. Coordination normal.  5/5 strength of all major muscle groups. Sensation to light touch intact throughout. Walks with a coordinated, steady gait.   Skin: Skin is warm and dry.  No erythema, vesicles, ecchymosis or other skin changes noted over right neck or back.   Psychiatric: She has a normal mood and affect. Her behavior is normal.  Nursing note and vitals reviewed.   ED Course  Procedures (including critical care time) DIAGNOSTIC STUDIES: Oxygen Saturation is 100% on RA, normal  by my interpretation.    COORDINATION OF CARE: 4:51 PM-Discussed treatment plan with pt at bedside and pt agreed to plan.   Labs Review Labs Reviewed - No data to display  Imaging Review No results found.    EKG Interpretation None      MDM   Final diagnoses:  Neck pain  Right-sided low back pain without sciatica   40 year old female presenting with neck and back pain. Both are right-sided. 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 Afebrile and nontoxic appearing. Tenderness to right. Portion of trapezius. Spasm present. No tenderness over cervical spine. Full range of motion of neck intact. She was tenderness of the right lumbar region. No tenderness over lumbar spine. Full range of motion of the back and bilateral lower extremities. Negative straight leg raise. All extremities are neurovascularly intact. No indication for imaging at this time. Presentation consistent with musculoskeletal pain. No concern for cauda equina. We'll treat with Toradol and Robaxin in the emergency department will discharge with Robaxin and instruction to use over-the-counter pain relievers such as Tylenol or Motrin. Also discussed heating pad for pain relief. Patient to follow-up with her PCP if symptoms do not improve. Return precautions given in discharge paperwork and discussed with pt at bedside. Pt stable for discharge   I  personally performed the services described in this documentation, which was scribed in my presence. The recorded information has been reviewed and is accurate.      Alveta Heimlich, PA-C 11/02/15 1723  Arby Barrette, MD 11/02/15 [phone]

## 2015-11-02 NOTE — ED Notes (Signed)
PT reports back pain started on Monday.

## 2015-11-02 NOTE — Discharge Instructions (Signed)
Back Pain, Adult °Back pain is very common in adults. The cause of back pain is rarely dangerous and the pain often gets better over time. The cause of your back pain may not be known. Some common causes of back pain include: °· Strain of the muscles or ligaments supporting the spine. °· Wear and tear (degeneration) of the spinal disks. °· Arthritis. °· Direct injury to the back. °For many people, back pain may return. Since back pain is rarely dangerous, most people can learn to manage this condition on their own. °HOME CARE INSTRUCTIONS °Watch your back pain for any changes. The following actions may help to lessen any discomfort you are feeling: °· Remain active. It is stressful on your back to sit or stand in one place for long periods of time. Do not sit, drive, or stand in one place for more than 30 minutes at a time. Take short walks on even surfaces as soon as you are able. Try to increase the length of time you walk each day. °· Exercise regularly as directed by your health care provider. Exercise helps your back heal faster. It also helps avoid future injury by keeping your muscles strong and flexible. °· Do not stay in bed. Resting more than 1-2 days can delay your recovery. °· Pay attention to your body when you bend and lift. The most comfortable positions are those that put less stress on your recovering back. Always use proper lifting techniques, including: °· Bending your knees. °· Keeping the load close to your body. °· Avoiding twisting. °· Find a comfortable position to sleep. Use a firm mattress and lie on your side with your knees slightly bent. If you lie on your back, put a pillow under your knees. °· Avoid feeling anxious or stressed. Stress increases muscle tension and can worsen back pain. It is important to recognize when you are anxious or stressed and learn ways to manage it, such as with exercise. °· Take medicines only as directed by your health care provider. Over-the-counter  medicines to reduce pain and inflammation are often the most helpful. Your health care provider may prescribe muscle relaxant drugs. These medicines help dull your pain so you can more quickly return to your normal activities and healthy exercise. °· Apply ice to the injured area: °· Put ice in a plastic bag. °· Place a towel between your skin and the bag. °· Leave the ice on for 20 minutes, 2-3 times a day for the first 2-3 days. After that, ice and heat may be alternated to reduce pain and spasms. °· Maintain a healthy weight. Excess weight puts extra stress on your back and makes it difficult to maintain good posture. °SEEK MEDICAL CARE IF: °· You have pain that is not relieved with rest or medicine. °· You have increasing pain going down into the legs or buttocks. °· You have pain that does not improve in one week. °· You have night pain. °· You lose weight. °· You have a fever or chills. °SEEK IMMEDIATE MEDICAL CARE IF:  °· You develop new bowel or bladder control problems. °· You have unusual weakness or numbness in your arms or legs. °· You develop nausea or vomiting. °· You develop abdominal pain. °· You feel faint. °  °This information is not intended to replace advice given to you by your health care provider. Make sure you discuss any questions you have with your health care provider. °  °Document Released: 08/20/2005 Document Revised: 09/10/2014 Document Reviewed: 12/22/2013 °[employer]vier Interactive Patient Education ©2016 [employer]  Back Exercises The following exercises strengthen the muscles that help to support the back. They also help to keep the lower back flexible. Doing these exercises can help to prevent back pain or lessen existing pain. If you have back pain or discomfort, try doing these exercises 2-3 times each day or as told by your health care provider. When the pain goes away, do them once each day, but increase the number of times that you repeat the steps for each exercise (do more  repetitions). If you do not have back pain or discomfort, do these exercises once each day or as told by your health care provider. EXERCISES Single Knee to Chest Repeat these steps 3-5 times for each leg:  Lie on your back on a firm bed or the floor with your legs extended.  Bring one knee to your chest. Your other leg should stay extended and in contact with the floor.  Hold your knee in place by grabbing your knee or thigh.  Pull on your knee until you feel a gentle stretch in your lower back.  Hold the stretch for 10-30 seconds.  Slowly release and straighten your leg. Pelvic Tilt Repeat these steps 5-10 times:  Lie on your back on a firm bed or the floor with your legs extended.  Bend your knees so they are pointing toward the ceiling and your feet are flat on the floor.  Tighten your lower abdominal muscles to press your lower back against the floor. This motion will tilt your pelvis so your tailbone points up toward the ceiling instead of pointing to your feet or the floor.  With gentle tension and even breathing, hold this position for 5-10 seconds. Cat-Cow Repeat these steps until your lower back becomes more flexible:  Get into a hands-and-knees position on a firm surface. Keep your hands under your shoulders, and keep your knees under your hips. You may place padding under your knees for comfort.  Let your head hang down, and point your tailbone toward the floor so your lower back becomes rounded like the back of a cat.  Hold this position for 5 seconds.  Slowly lift your head and point your tailbone up toward the ceiling so your back forms a sagging arch like the back of a cow.  Hold this position for 5 seconds. Press-Ups Repeat these steps 5-10 times:  Lie on your abdomen (face-down) on the floor.  Place your palms near your head, about shoulder-width apart.  While you keep your back as relaxed as possible and keep your hips on the floor, slowly straighten  your arms to raise the top half of your body and lift your shoulders. Do not use your back muscles to raise your upper torso. You may adjust the placement of your hands to make yourself more comfortable.  Hold this position for 5 seconds while you keep your back relaxed.  Slowly return to lying flat on the floor. Bridges Repeat these steps 10 times:  Lie on your back on a firm surface.  Bend your knees so they are pointing toward the ceiling and your feet are flat on the floor.  Tighten your buttocks muscles and lift your buttocks off of the floor until your waist is at almost the same height as your knees. You should feel the muscles working in your buttocks and the back of your thighs. If you do not feel these muscles, slide your feet 1-2 inches farther away from your buttocks.  Hold this  position for 3-5 seconds.  Slowly lower your hips to the starting position, and allow your buttocks muscles to relax completely. If this exercise is too easy, try doing it with your arms crossed over your chest. Abdominal Crunches Repeat these steps 5-10 times:  Lie on your back on a firm bed or the floor with your legs extended.  Bend your knees so they are pointing toward the ceiling and your feet are flat on the floor.  Cross your arms over your chest.  Tip your chin slightly toward your chest without bending your neck.  Tighten your abdominal muscles and slowly raise your trunk (torso) high enough to lift your shoulder blades a tiny bit off of the floor. Avoid raising your torso higher than that, because it can put too much stress on your low back and it does not help to strengthen your abdominal muscles.  Slowly return to your starting position. Back Lifts Repeat these steps 5-10 times:  Lie on your abdomen (face-down) with your arms at your sides, and rest your forehead on the floor.  Tighten the muscles in your legs and your buttocks.  Slowly lift your chest off of the floor while you  keep your hips pressed to the floor. Keep the back of your head in line with the curve in your back. Your eyes should be looking at the floor.  Hold this position for 3-5 seconds.  Slowly return to your starting position. SEEK MEDICAL CARE IF:  Your back pain or discomfort gets much worse when you do an exercise.  Your back pain or discomfort does not lessen within 2 hours after you exercise. If you have any of these problems, stop doing these exercises right away. Do not do them again unless your health care provider says that you can. SEEK IMMEDIATE MEDICAL CARE IF:  You develop sudden, severe back pain. If this happens, stop doing the exercises right away. Do not do them again unless your health care provider says that you can.   This information is not intended to replace advice given to you by your health care provider. Make sure you discuss any questions you have with your health care provider.   Document Released: 09/27/2004 Document Revised: 05/11/2015 Document Reviewed: 10/14/2014 Else[employer] Interactive Patient Education 2016 [employer]  Cervical Strain and Sprain With Rehab Cervical strain and sprain are injuries that commonly occur with "whiplash" injuries. Whiplash occurs when the neck is forcefully whipped backward or forward, such as during a motor vehicle accident or during contact sports. The muscles, ligaments, tendons, discs, and nerves of the neck are susceptible to injury when this occurs. RISK FACTORS Risk of having a whiplash injury increases if:  Osteoarthritis of the spine.  Situations that make head or neck accidents or trauma more likely.  High-risk sports (football, rugby, wrestling, hockey, auto racing, gymnastics, diving, contact karate, or boxing).  Poor strength and flexibility of the neck.  Previous neck injury.  Poor tackling technique.  Improperly fitted or padded equipment. SYMPTOMS   Pain or stiffness in the front or back of neck or  both.  Symptoms may present immediately or up to 24 hours after injury.  Dizziness, headache, nausea, and vomiting.  Muscle spasm with soreness and stiffness in the neck.  Tenderness and swelling at the injury site. PREVENTION  Learn and use proper technique (avoid tackling with the head, spearing, and head-butting; use proper falling techniques to avoid landing on the head).  Warm up and stretch properly before activity.  Maintain physical fitness:  Strength, flexibility, and endurance.  Cardiovascular fitness.  Wear properly fitted and padded protective equipment, such as padded soft collars, for participation in contact sports. PROGNOSIS  Recovery from cervical strain and sprain injuries is dependent on the extent of the injury. These injuries are usually curable in 1 week to 3 months with appropriate treatment.  RELATED COMPLICATIONS   Temporary numbness and weakness may occur if the nerve roots are damaged, and this may persist until the nerve has completely healed.  Chronic pain due to frequent recurrence of symptoms.  Prolonged healing, especially if activity is resumed too soon (before complete recovery). TREATMENT  Treatment initially involves the use of ice and medication to help reduce pain and inflammation. It is also important to perform strengthening and stretching exercises and modify activities that worsen symptoms so the injury does not get worse. These exercises may be performed at home or with a therapist. For patients who experience severe symptoms, a soft, padded collar may be recommended to be worn around the neck.  Improving your posture may help reduce symptoms. Posture improvement includes pulling your chin and abdomen in while sitting or standing. If you are sitting, sit in a firm chair with your buttocks against the back of the chair. While sleeping, try replacing your pillow with a small towel rolled to 2 inches in diameter, or use a cervical pillow or  soft cervical collar. Poor sleeping positions delay healing.  For patients with nerve root damage, which causes numbness or weakness, the use of a cervical traction apparatus may be recommended. Surgery is rarely necessary for these injuries. However, cervical strain and sprains that are present at birth (congenital) may require surgery. MEDICATION   If pain medication is necessary, nonsteroidal anti-inflammatory medications, such as aspirin and ibuprofen, or other minor pain relievers, such as acetaminophen, are often recommended.  Do not take pain medication for 7 days before surgery.  Prescription pain relievers may be given if deemed necessary by your caregiver. Use only as directed and only as much as you need. HEAT AND COLD:   Cold treatment (icing) relieves pain and reduces inflammation. Cold treatment should be applied for 10 to 15 minutes every 2 to 3 hours for inflammation and pain and immediately after any activity that aggravates your symptoms. Use ice packs or an ice massage.  Heat treatment may be used prior to performing the stretching and strengthening activities prescribed by your caregiver, physical therapist, or athletic trainer. Use a heat pack or a warm soak. SEEK MEDICAL CARE IF:   Symptoms get worse or do not improve in 2 weeks despite treatment.  New, unexplained symptoms develop (drugs used in treatment may produce side effects). EXERCISES RANGE OF MOTION (ROM) AND STRETCHING EXERCISES - Cervical Strain and Sprain These exercises may help you when beginning to rehabilitate your injury. In order to successfully resolve your symptoms, you must improve your posture. These exercises are designed to help reduce the forward-head and rounded-shoulder posture which contributes to this condition. Your symptoms may resolve with or without further involvement from your physician, physical therapist or athletic trainer. While completing these exercises, remember:   Restoring  tissue flexibility helps normal motion to return to the joints. This allows healthier, less painful movement and activity.  An effective stretch should be held for at least 20 seconds, although you may need to begin with shorter hold times for comfort.  A stretch should never be painful. You should only feel a gentle lengthening or  release in the stretched tissue. STRETCH- Axial Extensors  Lie on your back on the floor. You may bend your knees for comfort. Place a rolled-up hand towel or dish towel, about 2 inches in diameter, under the part of your head that makes contact with the floor.  Gently tuck your chin, as if trying to make a "double chin," until you feel a gentle stretch at the base of your head.  Hold __________ seconds. Repeat __________ times. Complete this exercise __________ times per day.  STRETCH - Axial Extension   Stand or sit on a firm surface. Assume a good posture: chest up, shoulders drawn back, abdominal muscles slightly tense, knees unlocked (if standing) and feet hip width apart.  Slowly retract your chin so your head slides back and your chin slightly lowers. Continue to look straight ahead.  You should feel a gentle stretch in the back of your head. Be certain not to feel an aggressive stretch since this can cause headaches later.  Hold for __________ seconds. Repeat __________ times. Complete this exercise __________ times per day. STRETCH - Cervical Side Bend   Stand or sit on a firm surface. Assume a good posture: chest up, shoulders drawn back, abdominal muscles slightly tense, knees unlocked (if standing) and feet hip width apart.  Without letting your nose or shoulders move, slowly tip your right / left ear to your shoulder until your feel a gentle stretch in the muscles on the opposite side of your neck.  Hold __________ seconds. Repeat __________ times. Complete this exercise __________ times per day. STRETCH - Cervical Rotators   Stand or sit on a  firm surface. Assume a good posture: chest up, shoulders drawn back, abdominal muscles slightly tense, knees unlocked (if standing) and feet hip width apart.  Keeping your eyes level with the ground, slowly turn your head until you feel a gentle stretch along the back and opposite side of your neck.  Hold __________ seconds. Repeat __________ times. Complete this exercise __________ times per day. RANGE OF MOTION - Neck Circles   Stand or sit on a firm surface. Assume a good posture: chest up, shoulders drawn back, abdominal muscles slightly tense, knees unlocked (if standing) and feet hip width apart.  Gently roll your head down and around from the back of one shoulder to the back of the other. The motion should never be forced or painful.  Repeat the motion 10-20 times, or until you feel the neck muscles relax and loosen. Repeat __________ times. Complete the exercise __________ times per day. STRENGTHENING EXERCISES - Cervical Strain and Sprain These exercises may help you when beginning to rehabilitate your injury. They may resolve your symptoms with or without further involvement from your physician, physical therapist, or athletic trainer. While completing these exercises, remember:   Muscles can gain both the endurance and the strength needed for everyday activities through controlled exercises.  Complete these exercises as instructed by your physician, physical therapist, or athletic trainer. Progress the resistance and repetitions only as guided.  You may experience muscle soreness or fatigue, but the pain or discomfort you are trying to eliminate should never worsen during these exercises. If this pain does worsen, stop and make certain you are following the directions exactly. If the pain is still present after adjustments, discontinue the exercise until you can discuss the trouble with your clinician. STRENGTH - Cervical Flexors, Isometric  Face a wall, standing about 6 inches  away. Place a small pillow, a ball about 6-8  inches in diameter, or a folded towel between your forehead and the wall.  Slightly tuck your chin and gently push your forehead into the soft object. Push only with mild to moderate intensity, building up tension gradually. Keep your jaw and forehead relaxed.  Hold 10 to 20 seconds. Keep your breathing relaxed.  Release the tension slowly. Relax your neck muscles completely before you start the next repetition. Repeat __________ times. Complete this exercise __________ times per day. STRENGTH- Cervical Lateral Flexors, Isometric   Stand about 6 inches away from a wall. Place a small pillow, a ball about 6-8 inches in diameter, or a folded towel between the side of your head and the wall.  Slightly tuck your chin and gently tilt your head into the soft object. Push only with mild to moderate intensity, building up tension gradually. Keep your jaw and forehead relaxed.  Hold 10 to 20 seconds. Keep your breathing relaxed.  Release the tension slowly. Relax your neck muscles completely before you start the next repetition. Repeat __________ times. Complete this exercise __________ times per day. STRENGTH - Cervical Extensors, Isometric   Stand about 6 inches away from a wall. Place a small pillow, a ball about 6-8 inches in diameter, or a folded towel between the back of your head and the wall.  Slightly tuck your chin and gently tilt your head back into the soft object. Push only with mild to moderate intensity, building up tension gradually. Keep your jaw and forehead relaxed.  Hold 10 to 20 seconds. Keep your breathing relaxed.  Release the tension slowly. Relax your neck muscles completely before you start the next repetition. Repeat __________ times. Complete this exercise __________ times per day. POSTURE AND BODY MECHANICS CONSIDERATIONS - Cervical Strain and Sprain Keeping correct posture when sitting, standing or completing your  activities will reduce the stress put on different body tissues, allowing injured tissues a chance to heal and limiting painful experiences. The following are general guidelines for improved posture. Your physician or physical therapist will provide you with any instructions specific to your needs. While reading these guidelines, remember:  The exercises prescribed by your provider will help you have the flexibility and strength to maintain correct postures.  The correct posture provides the optimal environment for your joints to work. All of your joints have less wear and tear when properly supported by a spine with good posture. This means you will experience a healthier, less painful body.  Correct posture must be practiced with all of your activities, especially prolonged sitting and standing. Correct posture is as important when doing repetitive low-stress activities (typing) as it is when doing a single heavy-load activity (lifting). PROLONGED STANDING WHILE SLIGHTLY LEANING FORWARD When completing a task that requires you to lean forward while standing in one place for a long time, place either foot up on a stationary 2- to 4-inch high object to help maintain the best posture. When both feet are on the ground, the low back tends to lose its slight inward curve. If this curve flattens (or becomes too large), then the back and your other joints will experience too much stress, fatigue more quickly, and can cause pain.  RESTING POSITIONS Consider which positions are most painful for you when choosing a resting position. If you have pain with flexion-based activities (sitting, bending, stooping, squatting), choose a position that allows you to rest in a less flexed posture. You would want to avoid curling into a fetal position on your side. If  your pain worsens with extension-based activities (prolonged standing, working overhead), avoid resting in an extended position such as sleeping on your stomach.  Most people will find more comfort when they rest with their spine in a more neutral position, neither too rounded nor too arched. Lying on a non-sagging bed on your side with a pillow between your knees, or on your back with a pillow under your knees will often provide some relief. Keep in mind, being in any one position for a prolonged period of time, no matter how correct your posture, can still lead to stiffness. WALKING Walk with an upright posture. Your ears, shoulders, and hips should all line up. OFFICE WORK When working at a desk, create an environment that supports good, upright posture. Without extra support, muscles fatigue and lead to excessive strain on joints and other tissues. CHAIR:  A chair should be able to slide under your desk when your back makes contact with the back of the chair. This allows you to work closely.  The chair's height should allow your eyes to be level with the upper part of your monitor and your hands to be slightly lower than your elbows.  Body position:  Your feet should make contact with the floor. If this is not possible, use a foot rest.  Keep your ears over your shoulders. This will reduce stress on your neck and low back.   This information is not intended to replace advice given to you by your health care provider. Make sure you discuss any questions you have with your health care provider.   Document Released: 08/20/2005 Document Revised: 09/10/2014 Document Reviewed: 12/02/2008 [employer] Interactive Patient Education [employer]

## 2015-11-02 NOTE — ED Notes (Signed)
Declined W/C at D/C and was escorted to lobby by RN. 

## 2015-11-20 ENCOUNTER — Encounter (HOSPITAL_COMMUNITY): Payer: Self-pay | Admitting: Adult Health

## 2015-11-20 ENCOUNTER — Emergency Department (HOSPITAL_COMMUNITY): Payer: Medicaid Other

## 2015-11-20 ENCOUNTER — Emergency Department (HOSPITAL_COMMUNITY)
Admission: EM | Admit: 2015-11-20 | Discharge: 2015-11-20 | Disposition: A | Discharge status: Home / Self Care | Payer: Medicaid Other | Attending: Emergency Medicine | Admitting: Emergency Medicine

## 2015-11-20 DIAGNOSIS — Z880 Allergy status to penicillin: Secondary | ICD-10-CM | POA: Diagnosis not present

## 2015-11-20 DIAGNOSIS — M62830 Muscle spasm of back: Secondary | ICD-10-CM | POA: Diagnosis not present

## 2015-11-20 DIAGNOSIS — R509 Fever, unspecified: Secondary | ICD-10-CM | POA: Insufficient documentation

## 2015-11-20 DIAGNOSIS — J45909 Unspecified asthma, uncomplicated: Secondary | ICD-10-CM | POA: Insufficient documentation

## 2015-11-20 DIAGNOSIS — R112 Nausea with vomiting, unspecified: Secondary | ICD-10-CM | POA: Insufficient documentation

## 2015-11-20 DIAGNOSIS — Z79899 Other long term (current) drug therapy: Secondary | ICD-10-CM | POA: Diagnosis not present

## 2015-11-20 DIAGNOSIS — R05 Cough: Secondary | ICD-10-CM | POA: Diagnosis present

## 2015-11-20 DIAGNOSIS — F17210 Nicotine dependence, cigarettes, uncomplicated: Secondary | ICD-10-CM | POA: Insufficient documentation

## 2015-11-20 DIAGNOSIS — Z87440 Personal history of urinary (tract) infections: Secondary | ICD-10-CM | POA: Diagnosis not present

## 2015-11-20 DIAGNOSIS — R6889 Other general symptoms and signs: Secondary | ICD-10-CM

## 2015-11-20 MED ORDER — OSELTAMIVIR PHOSPHATE 75 MG PO CAPS: 75.0000 mg | ORAL_CAPSULE | Freq: Two times a day (BID) | ORAL | Status: DC

## 2015-11-20 MED ORDER — METHOCARBAMOL 500 MG PO TABS
750.0000 mg | ORAL_TABLET | Freq: Once | ORAL | Status: AC
  Administered 2015-11-20: 750 mg via ORAL
  Filled 2015-11-20: qty 2

## 2015-11-20 MED ORDER — BENZONATATE 100 MG PO CAPS: 100.0000 mg | ORAL_CAPSULE | Freq: Three times a day (TID) | ORAL | Status: DC

## 2015-11-20 MED ORDER — IBUPROFEN 800 MG PO TABS: 800.0000 mg | ORAL_TABLET | Freq: Three times a day (TID) | ORAL | Status: DC

## 2015-11-20 MED ORDER — ONDANSETRON 4 MG PO TBDP
4.0000 mg | ORAL_TABLET | Freq: Once | ORAL | Status: AC
  Administered 2015-11-20: 4 mg via ORAL
  Filled 2015-11-20: qty 1

## 2015-11-20 MED ORDER — KETOROLAC TROMETHAMINE 30 MG/ML IJ SOLN
30.0000 mg | Freq: Once | INTRAMUSCULAR | Status: AC
  Administered 2015-11-20: 30 mg via INTRAMUSCULAR
  Filled 2015-11-20: qty 1

## 2015-11-20 MED ORDER — GUAIFENESIN ER 600 MG PO TB12: 600.0000 mg | ORAL_TABLET | Freq: Two times a day (BID) | ORAL | Status: DC | PRN

## 2015-11-20 NOTE — ED Notes (Signed)
Patient transported to X-ray 

## 2015-11-20 NOTE — ED Provider Notes (Signed)
CSN: [id number]     Arrival date & time 11/20/15  1358 History  By signing my name below, I, Soijett Blue, attest that this documentation has been prepared under the direction and in the presence of Tilton Marsalis Y. [place] Alfredo, PA-C Electronically Signed: Soijett Blue, ED Scribe. 11/20/2015. 2:48 PM.  Chief Complaint  Patient presents with  . Cough      The history is provided by the patient. No language interpreter was used.    Mary [name] is a 40 y.o. female who presents to the Emergency Department complaining of productive cough onset 1 day. She thinks that she may have flu now. She states that she is having associated symptoms of vomiting x 5 episodes, nausea, generalized body aches, chills, subjective fever, and left lower back spasms. She states her spasms are from coughing so much. Pt notes that her back spasms are similar to when she was seen in the ED for right lower back spasms on 11/02/2015 and was Rx muscle relaxers for her pain which alleviated her symptoms. However spasms have started again since onset of her URI. She states that she has tried mucinex PM for the relief for her symptoms. She denies any other symptoms.   Past Medical History  Diagnosis Date  . Asthma   . Urinary tract infection   . JYNWGNFA(213.0Headache(784.0)    Past Surgical History  Procedure Laterality Date  . No past surgeries    . Multiple tooth extractions  08/2012    x 6 teeth  . Cesarean section N/A 11/29/2012    Procedure: Primary cesarean section with delivery of baby girl at 1840.  Apgars 8/9.;  Surgeon: Kathreen CosierBernard A Marshall, MD;  Location: [facility];  Service: Obstetrics;  Laterality: N/A;   Family History  Problem Relation Age of Onset  . Other Neg Hx   . Alcohol abuse Neg Hx   . Birth defects Neg Hx   . Cancer Neg Hx   . COPD Neg Hx   . Stroke Neg Hx   . Vision loss Neg Hx   . Miscarriages / Stillbirths Neg Hx   . Mental retardation Neg Hx   . Mental illness Neg Hx   . Learning disabilities Neg Hx   . Kidney  disease Neg Hx   . Heart disease Neg Hx   . Hearing loss Neg Hx   . Early death Neg Hx   . Arthritis Mother   . Depression Mother   . Hypertension Mother   . Drug abuse Mother   . Asthma Maternal Aunt   . Hyperlipidemia Maternal Aunt   . Diabetes Maternal Grandmother    Social History  Substance Use Topics  . Smoking status: Current Every Day Smoker -- 0.50 packs/day for 10 years    Types: Cigarettes  . Smokeless tobacco: Never Used  . Alcohol Use: 0.0 oz/week    0 Standard drinks or equivalent per week   OB History    Gravida Para Term Preterm AB TAB SAB Ectopic Multiple Living   2 1 1  1  1   1      Review of Systems  Constitutional: Positive for fever (subjective) and chills.  Respiratory: Positive for cough.   Gastrointestinal: Positive for nausea and vomiting.  Musculoskeletal: Positive for myalgias and back pain (left lower back spasms).  All other systems reviewed and are negative.     Allergies  Bee venom; Shellfish allergy; and Penicillins  Home Medications   Prior to Admission medications   Medication  Sig Start Date End Date Taking? Authorizing Provider  albuterol (PROVENTIL HFA;VENTOLIN HFA) 108 (90 BASE) MCG/ACT inhaler Inhale 2 puffs into the lungs every 6 (six) hours as needed for wheezing or shortness of breath. Shortness of breath 05/25/15   Brock Bad, MD  EPINEPHrine 0.3 mg/0.3 mL IJ SOAJ injection Inject 0.3 mLs (0.3 mg total) into the muscle once. 05/25/15   Brock Bad, MD  methocarbamol (ROBAXIN) 500 MG tablet Take 1 tablet (500 mg total) by mouth 2 (two) times daily. 11/02/15   Stevi Barrett, PA-C  metroNIDAZOLE (FLAGYL) 500 MG tablet Take 4 tablets (2,000 mg total) by mouth once. 06/03/15   Brock Bad, MD  metroNIDAZOLE (METROGEL VAGINAL) 0.75 % vaginal gel Place 1 Applicatorful vaginally at bedtime. 06/03/15   Brock Bad, MD  Prenat-FePoly-Metf-FA-DHA-DSS (VITAFOL FE+) 90-1-200 & 50 MG CPPK Take 1 capsule by mouth daily before  breakfast. 05/25/15   Brock Bad, MD  traMADol (ULTRAM) 50 MG tablet Take 1 tablet (50 mg total) by mouth every 6 (six) hours as needed. Patient not taking: Reported on 05/25/2015 01/16/15   Jaynie Crumble, PA-C  varenicline (CHANTIX STARTING MONTH PAK) 0.5 MG X 11 & 1 MG X 42 tablet Take one 0.5 mg tablet by mouth once daily for 3 days, then increase to one 0.5 mg tablet twice daily for 4 days, then increase to one 1 mg tablet twice daily. 05/25/15   Brock Bad, MD   BP 117/76 mmHg  Pulse 83  Temp(Src) 98.4 F (36.9 C) (Oral)  Resp 20  Wt 145 lb (65.772 kg)  SpO2 99%  LMP 11/02/2015 Physical Exam  Constitutional: She is oriented to person, place, and time. She appears well-developed and well-nourished. No distress.  Appears uncomfortable, laying in bed shivering.  HENT:  Head: Normocephalic and atraumatic.  Eyes: EOM are normal.  Neck: Neck supple.  Cardiovascular: Normal rate, regular rhythm and normal heart sounds.   Pulmonary/Chest: Effort normal and breath sounds normal. No respiratory distress. She has no wheezes. She has no rales.  Spastic productive cough.   Abdominal: Soft. There is no tenderness.  Musculoskeletal: Normal range of motion.       Back:  Left lumbar paraspinal tenderness and spasms. No midline spinal tenderness.  Neurological: She is alert and oriented to person, place, and time.  Skin: Skin is warm and dry.  Psychiatric: She has a normal mood and affect. Her behavior is normal.  Nursing note and vitals reviewed.   ED Course  Procedures (including critical care time) DIAGNOSTIC STUDIES: Oxygen Saturation is 99% on RA, nl by my interpretation.    COORDINATION OF CARE: 2:44 PM Discussed treatment plan with pt at bedside which includes CXR, continue muscle relaxers and pt agreed to plan.    Labs Review Labs Reviewed - No data to display  Imaging Review Dg Chest 2 View  11/20/2015  CLINICAL DATA:  Chest pain and cough EXAM: CHEST  2 VIEW  COMPARISON:  None. FINDINGS: Lungs are clear. Heart size and pulmonary vascularity are normal. No adenopathy. No pneumothorax. No bone lesions. IMPRESSION: No edema or consolidation. Electronically Signed   By: Bretta Bang III M.D.   On: 11/20/2015 15:26   I have personally reviewed and evaluated these images as part of my medical decision-making.   EKG Interpretation None      MDM   Final diagnoses:  Flu-like symptoms    XR negative. Suspect viral syndrome, likely the flu. Rx given for tamiflu  given onset of symptoms <48 hours. Rx also given for supportive meds including tessalon, motrin, and mucinex. Back spasm/pain and body aches improved with toradol in the ED. REsource guide given to establish pcp for f/u. ER return precautions given.  I personally performed the services described in this documentation, which was scribed in my presence. The recorded information has been reviewed and is accurate.    Carlene Coria, PA-C 11/21/15 1247  Glynn Octave, MD 11/21/15 [phone]

## 2015-11-20 NOTE — ED Notes (Signed)
Presents with cough, chills and runny nose-cough is productive with green and yellow sputum-took mucinex last night with no relief. Reports back spasms with cough that are causing her pain-she was seen earlier this week for the back spasms and needed a shot to help her with the pain.

## 2016-08-15 ENCOUNTER — Encounter (HOSPITAL_COMMUNITY): Payer: Self-pay | Admitting: *Deleted

## 2016-08-15 ENCOUNTER — Emergency Department (HOSPITAL_COMMUNITY): Payer: Medicaid Other

## 2016-08-15 ENCOUNTER — Emergency Department (HOSPITAL_COMMUNITY)
Admission: EM | Admit: 2016-08-15 | Discharge: 2016-08-16 | Disposition: A | Discharge status: Home / Self Care | Payer: Medicaid Other | Attending: Emergency Medicine | Admitting: Emergency Medicine

## 2016-08-15 DIAGNOSIS — F17210 Nicotine dependence, cigarettes, uncomplicated: Secondary | ICD-10-CM | POA: Insufficient documentation

## 2016-08-15 DIAGNOSIS — B9789 Other viral agents as the cause of diseases classified elsewhere: Secondary | ICD-10-CM

## 2016-08-15 DIAGNOSIS — J069 Acute upper respiratory infection, unspecified: Secondary | ICD-10-CM | POA: Insufficient documentation

## 2016-08-15 DIAGNOSIS — J45909 Unspecified asthma, uncomplicated: Secondary | ICD-10-CM | POA: Insufficient documentation

## 2016-08-15 MED ORDER — GUAIFENESIN ER 1200 MG PO TB12: 1.0000 | ORAL_TABLET | Freq: Two times a day (BID) | ORAL | 0 refills | Status: DC

## 2016-08-15 MED ORDER — PREDNISONE 50 MG PO TABS: 50.0000 mg | ORAL_TABLET | Freq: Every day | ORAL | 0 refills | Status: DC

## 2016-08-15 MED ORDER — ALBUTEROL SULFATE (2.5 MG/3ML) 0.083% IN NEBU
5.0000 mg | INHALATION_SOLUTION | Freq: Once | RESPIRATORY_TRACT | Status: AC
  Administered 2016-08-15: 5 mg via RESPIRATORY_TRACT
  Filled 2016-08-15: qty 6

## 2016-08-15 MED ORDER — ALBUTEROL SULFATE HFA 108 (90 BASE) MCG/ACT IN AERS
2.0000 | INHALATION_SPRAY | Freq: Four times a day (QID) | RESPIRATORY_TRACT | Status: DC
  Administered 2016-08-16: 2 via RESPIRATORY_TRACT
  Filled 2016-08-15: qty 6.7

## 2016-08-15 MED ORDER — AEROCHAMBER PLUS W/MASK MISC
1.0000 | Freq: Once | Status: AC
  Administered 2016-08-16: 1
  Filled 2016-08-15: qty 1

## 2016-08-15 MED ORDER — PROMETHAZINE-DM 6.25-15 MG/5ML PO SYRP: 5.0000 mL | ORAL_SOLUTION | Freq: Four times a day (QID) | ORAL | 0 refills | Status: DC | PRN

## 2016-08-15 NOTE — ED Triage Notes (Signed)
Pt c/o sob, rhinorrhea, and productive cough x 2 days. Has taken mucinex without relief. Reports having chest tightness, lung sounds clear, has been coughing up yellow phlegm

## 2016-08-15 NOTE — ED Provider Notes (Signed)
[facility]-EMERGENCY DEPT Provider Note   CSN: [id number] Arrival date & time: 08/15/16  2100  By signing my name below, I, Freida Busmaniana Omoyeni, attest that this documentation has been prepared under the direction and in the presence of Eli Lilly and CompanyChristopher Ossie Yebra, PA-C. Electronically Signed: Freida Busmaniana Omoyeni, Scribe. 08/15/2016. 10:26 PM.  History   Chief Complaint Chief Complaint  Patient presents with  . URI    The history is provided by the patient. No language interpreter was used.    HPI Comments:  Mary [name] is a 40 y.o. female with a history of asthma, who presents to the Emergency Department complaining of productive cough with associated rhinorrhea x a few days. She also notes subjective fever yesterday that has resolved. Pt has taken mucinex and flonase with minimal relief. She denies body aches, vomiting, and diarrhea. Pt smokes ~ 4 cigarettes a day.     Past Medical History:  Diagnosis Date  . Asthma   . Headache(784.0)   . Urinary tract infection     Patient Active Problem List   Diagnosis Date Noted  . Cesarean delivery delivered 12/01/2012  . Prolonged pregnancy, 41 weeks 11/28/2012  . Trichomonas 05/01/2012  . Positive urine pregnancy test 05/01/2012  . Urinary tract infection     Past Surgical History:  Procedure Laterality Date  . CESAREAN SECTION N/A 11/29/2012   Procedure: Primary cesarean section with delivery of baby girl at 1840.  Apgars 8/9.;  Surgeon: Kathreen CosierBernard A Marshall, MD;  Location: [facility];  Service: Obstetrics;  Laterality: N/A;  . MULTIPLE TOOTH EXTRACTIONS  08/2012   x 6 teeth  . NO PAST SURGERIES      OB History    Gravida Para Term Preterm AB Living   2 1 1   1 1    SAB TAB Ectopic Multiple Live Births   1       1       Home Medications    Prior to Admission medications   Medication Sig Start Date End Date Taking? Authorizing Provider  albuterol (PROVENTIL HFA;VENTOLIN HFA) 108 (90 BASE) MCG/ACT inhaler Inhale 2 puffs into the lungs every 6  (six) hours as needed for wheezing or shortness of breath. Shortness of breath 05/25/15   Brock Badharles A Harper, MD  benzonatate (TESSALON) 100 MG capsule Take 1 capsule (100 mg total) by mouth every 8 (eight) hours. 11/20/15   Ace GinsSerena Y Sam, PA-C  EPINEPHrine 0.3 mg/0.3 mL IJ SOAJ injection Inject 0.3 mLs (0.3 mg total) into the muscle once. 05/25/15   Brock Badharles A Harper, MD  guaiFENesin (MUCINEX) 600 MG 12 hr tablet Take 1 tablet (600 mg total) by mouth 2 (two) times daily as needed for to loosen phlegm. 11/20/15   Ace GinsSerena Y Sam, PA-C  ibuprofen (ADVIL,MOTRIN) 800 MG tablet Take 1 tablet (800 mg total) by mouth 3 (three) times daily. 11/20/15   Ace GinsSerena Y Sam, PA-C  methocarbamol (ROBAXIN) 500 MG tablet Take 1 tablet (500 mg total) by mouth 2 (two) times daily. 11/02/15   Stevi Barrett, PA-C  metroNIDAZOLE (FLAGYL) 500 MG tablet Take 4 tablets (2,000 mg total) by mouth once. 06/03/15   Brock Badharles A Harper, MD  metroNIDAZOLE (METROGEL VAGINAL) 0.75 % vaginal gel Place 1 Applicatorful vaginally at bedtime. 06/03/15   Brock Badharles A Harper, MD  oseltamivir (TAMIFLU) 75 MG capsule Take 1 capsule (75 mg total) by mouth every 12 (twelve) hours. 11/20/15   Ace GinsSerena Y Sam, PA-C  Prenat-FePoly-Metf-FA-DHA-DSS (VITAFOL FE+) 90-1-200 & 50 MG CPPK Take 1 capsule by  mouth daily before breakfast. 05/25/15   Brock Badharles A Harper, MD  traMADol (ULTRAM) 50 MG tablet Take 1 tablet (50 mg total) by mouth every 6 (six) hours as needed. Patient not taking: Reported on 05/25/2015 01/16/15   Jaynie Crumbleatyana Kirichenko, PA-C  varenicline (CHANTIX STARTING MONTH PAK) 0.5 MG X 11 & 1 MG X 42 tablet Take one 0.5 mg tablet by mouth once daily for 3 days, then increase to one 0.5 mg tablet twice daily for 4 days, then increase to one 1 mg tablet twice daily. 05/25/15   Brock Badharles A Harper, MD    Family History Family History  Problem Relation Age of Onset  . Arthritis Mother   . Depression Mother   . Hypertension Mother   . Drug abuse Mother   . Asthma Maternal Aunt     . Hyperlipidemia Maternal Aunt   . Diabetes Maternal Grandmother   . Other Neg Hx   . Alcohol abuse Neg Hx   . Birth defects Neg Hx   . Cancer Neg Hx   . COPD Neg Hx   . Stroke Neg Hx   . Vision loss Neg Hx   . Miscarriages / Stillbirths Neg Hx   . Mental retardation Neg Hx   . Mental illness Neg Hx   . Learning disabilities Neg Hx   . Kidney disease Neg Hx   . Heart disease Neg Hx   . Hearing loss Neg Hx   . Early death Neg Hx     Social History Social History  Substance Use Topics  . Smoking status: Current Every Day Smoker    Packs/day: 0.50    Years: 10.00    Types: Cigarettes  . Smokeless tobacco: Never Used  . Alcohol use 0.0 oz/week     Allergies   Bee venom; Shellfish allergy; and Penicillins   Review of Systems Review of Systems  Constitutional: Positive for fever (resolved).  HENT: Positive for rhinorrhea.   Respiratory: Positive for cough.   Gastrointestinal: Negative for diarrhea and vomiting.  Musculoskeletal: Negative for myalgias.   Physical Exam Updated Vital Signs BP 115/86   Pulse 78   Temp 98.3 F (36.8 C) (Oral)   Resp 16   LMP 07/19/2016   SpO2 100%   Physical Exam  Constitutional: She is oriented to person, place, and time. She appears well-developed and well-nourished. No distress.  HENT:  Head: Normocephalic and atraumatic.  Nose: Mucosal edema present.  Eyes: Pupils are equal, round, and reactive to light.  Cardiovascular: Normal rate and regular rhythm.   Pulmonary/Chest: Effort normal and breath sounds normal. No respiratory distress.  Neurological: She is alert and oriented to person, place, and time.  Skin: Skin is warm and dry.  Psychiatric: She has a normal mood and affect.  Nursing note and vitals reviewed.    ED Treatments / Results  DIAGNOSTIC STUDIES:  Oxygen Saturation is 100% on RA, normal by my interpretation.    COORDINATION OF CARE:  10:23 PM Discussed treatment plan with pt at bedside and pt agreed  to plan.  Labs (all labs ordered are listed, but only abnormal results are displayed) Labs Reviewed - No data to display  EKG  EKG Interpretation None       Radiology Dg Chest 2 View  Result Date: 08/15/2016 CLINICAL DATA:  Cough and congestion for 2 days.  History of asthma. EXAM: CHEST  2 VIEW COMPARISON:  Chest radiograph November 20, 2015 FINDINGS: Cardiomediastinal silhouette is normal. No pleural effusions or focal  consolidations. Trachea projects midline and there is no pneumothorax. Soft tissue planes and included osseous structures are non-suspicious. Mild bilateral acromioclavicular undersurface spurring. IMPRESSION: Normal chest radiograph. Electronically Signed   By: Awilda Metro M.D.   On: 08/15/2016 21:36    Procedures Procedures (including critical care time)  Medications Ordered in ED Medications  albuterol (PROVENTIL) (2.5 MG/3ML) 0.083% nebulizer solution 5 mg (not administered)     Initial Impression / Assessment and Plan / ED Course  I have reviewed the triage vital signs and the nursing notes.  Pertinent labs & imaging results that were available during my care of the patient were reviewed by me and considered in my medical decision making (see chart for details).  Clinical Course       Pt symptoms consistent with URI. CXR negative for acute infiltrate. Pt given breathing treatment in the ED. Pt will be discharged with symptomatic treatment.  Discussed return precautions.  Pt is hemodynamically stable & in NAD prior to discharge.  Final Clinical Impressions(s) / ED Diagnoses   Final diagnoses:  None    New Prescriptions New Prescriptions   No medications on file  I personally performed the services described in this documentation, which was scribed in my presence. The recorded information has been reviewed and is accurate.     Charlestine Night, PA-C 08/15/16 2348    Raeford Razor, MD 08/16/16 [phone]

## 2016-08-15 NOTE — Discharge Instructions (Signed)
Increase your fluid intake, rest, much possible.  Follow-up with your primary care doctor

## 2017-02-06 ENCOUNTER — Ambulatory Visit (INDEPENDENT_AMBULATORY_CARE_PROVIDER_SITE_OTHER): Payer: Self-pay

## 2017-02-06 ENCOUNTER — Ambulatory Visit (HOSPITAL_COMMUNITY)
Admission: EM | Admit: 2017-02-06 | Discharge: 2017-02-06 | Disposition: A | Discharge status: Home / Self Care | Payer: Medicaid Other | Attending: Internal Medicine | Admitting: Internal Medicine

## 2017-02-06 ENCOUNTER — Encounter (HOSPITAL_COMMUNITY): Payer: Self-pay | Admitting: *Deleted

## 2017-02-06 DIAGNOSIS — M545 Low back pain: Secondary | ICD-10-CM

## 2017-02-06 DIAGNOSIS — G8929 Other chronic pain: Secondary | ICD-10-CM

## 2017-02-06 DIAGNOSIS — M62830 Muscle spasm of back: Secondary | ICD-10-CM

## 2017-02-06 MED ORDER — CYCLOBENZAPRINE HCL 10 MG PO TABS: 5.0000 mg | ORAL_TABLET | Freq: Three times a day (TID) | ORAL | 0 refills | Status: DC | PRN

## 2017-02-06 MED ORDER — MELOXICAM 15 MG PO TABS: 7.5000 mg | ORAL_TABLET | Freq: Every day | ORAL | 0 refills | Status: AC

## 2017-02-06 NOTE — ED Provider Notes (Signed)
`CSN: [id number]     Arrival date & time 02/06/17  1139 History   None    Chief Complaint  Patient presents with  . Back Pain   (Consider location/radiation/quality/duration/timing/severity/associated sxs/prior Treatment) HPI Here for complaint of acute on chronic back pain.  Middle lumbar pain worsened yesterday after falling down some stairs at her home.  Feels that she is getting worse. Denies any leg weakness.  Does associates some tingling about L3 dermatome and this is new for her.  She tried 400 mg ibuprofen last night and this did not help. Husband is status post vasectomy. She is currently one her cycle.   Past Medical History:  Diagnosis Date  . Asthma   . Headache(784.0)   . Urinary tract infection    Past Surgical History:  Procedure Laterality Date  . CESAREAN SECTION N/A 11/29/2012   Procedure: Primary cesarean section with delivery of baby girl at 1840.  Apgars 8/9.;  Surgeon: Kathreen Cosier, MD;  Location: [facility];  Service: Obstetrics;  Laterality: N/A;  . MULTIPLE TOOTH EXTRACTIONS  08/2012   x 6 teeth  . NO PAST SURGERIES     Family History  Problem Relation Age of Onset  . Arthritis Mother   . Depression Mother   . Hypertension Mother   . Drug abuse Mother   . Asthma Maternal Aunt   . Hyperlipidemia Maternal Aunt   . Diabetes Maternal Grandmother   . Other Neg Hx   . Alcohol abuse Neg Hx   . Birth defects Neg Hx   . Cancer Neg Hx   . COPD Neg Hx   . Stroke Neg Hx   . Vision loss Neg Hx   . Miscarriages / Stillbirths Neg Hx   . Mental retardation Neg Hx   . Mental illness Neg Hx   . Learning disabilities Neg Hx   . Kidney disease Neg Hx   . Heart disease Neg Hx   . Hearing loss Neg Hx   . Early death Neg Hx    Social History  Substance Use Topics  . Smoking status: Current Every Day Smoker    Packs/day: 0.50    Years: 10.00    Types: Cigarettes  . Smokeless tobacco: Never Used  . Alcohol use 0.0 oz/week   OB History    Gravida Para Term  Preterm AB Living   2 1 1   1 1    SAB TAB Ectopic Multiple Live Births   1       1     Review of Systems  Constitutional: Negative for appetite change and fatigue.  Genitourinary: Negative for difficulty urinating and flank pain.  Musculoskeletal: Positive for back pain. Negative for gait problem, joint swelling, myalgias, neck pain and neck stiffness.  Neurological: Negative for dizziness.    Allergies  Bee venom; Shellfish allergy; and Penicillins  Home Medications   Prior to Admission medications   Medication Sig Start Date End Date Taking? Authorizing Provider  albuterol (PROVENTIL HFA;VENTOLIN HFA) 108 (90 BASE) MCG/ACT inhaler Inhale 2 puffs into the lungs every 6 (six) hours as needed for wheezing or shortness of breath. Shortness of breath 05/25/15   Brock Bad, MD  benzonatate (TESSALON) 100 MG capsule Take 1 capsule (100 mg total) by mouth every 8 (eight) hours. 11/20/15   Sam, Ace Gins, PA-C  EPINEPHrine 0.3 mg/0.3 mL IJ SOAJ injection Inject 0.3 mLs (0.3 mg total) into the muscle once. 05/25/15   Brock Bad, MD  Guaifenesin  1200 MG TB12 Take 1 tablet (1,200 mg total) by mouth 2 (two) times daily. 08/15/16   Lawyer, Cristal Deerhristopher, PA-C  ibuprofen (ADVIL,MOTRIN) 800 MG tablet Take 1 tablet (800 mg total) by mouth 3 (three) times daily. 11/20/15   Sam, Ace GinsSerena Y, PA-C  methocarbamol (ROBAXIN) 500 MG tablet Take 1 tablet (500 mg total) by mouth 2 (two) times daily. 11/02/15   Barrett, Rolm GalaStevi, PA-C  metroNIDAZOLE (FLAGYL) 500 MG tablet Take 4 tablets (2,000 mg total) by mouth once. 06/03/15   Brock BadHarper, Charles A, MD  metroNIDAZOLE (METROGEL VAGINAL) 0.75 % vaginal gel Place 1 Applicatorful vaginally at bedtime. 06/03/15   Brock BadHarper, Charles A, MD  oseltamivir (TAMIFLU) 75 MG capsule Take 1 capsule (75 mg total) by mouth every 12 (twelve) hours. 11/20/15   Sam, Ace GinsSerena Y, PA-C  predniSONE (DELTASONE) 50 MG tablet Take 1 tablet (50 mg total) by mouth daily. 08/15/16   Lawyer,  Cristal Deerhristopher, PA-C  Prenat-FePoly-Metf-FA-DHA-DSS (VITAFOL FE+) 90-1-200 & 50 MG CPPK Take 1 capsule by mouth daily before breakfast. 05/25/15   Brock BadHarper, Charles A, MD  promethazine-dextromethorphan (PROMETHAZINE-DM) 6.25-15 MG/5ML syrup Take 5 mLs by mouth 4 (four) times daily as needed for cough. 08/15/16   Lawyer, Cristal Deerhristopher, PA-C  traMADol (ULTRAM) 50 MG tablet Take 1 tablet (50 mg total) by mouth every 6 (six) hours as needed. Patient not taking: Reported on 05/25/2015 01/16/15   Jaynie CrumbleKirichenko, Tatyana, PA-C  varenicline (CHANTIX STARTING MONTH PAK) 0.5 MG X 11 & 1 MG X 42 tablet Take one 0.5 mg tablet by mouth once daily for 3 days, then increase to one 0.5 mg tablet twice daily for 4 days, then increase to one 1 mg tablet twice daily. 05/25/15   Brock BadHarper, Charles A, MD   Meds Ordered and Administered this Visit  Medications - No data to display  BP 132/70 (BP Location: Right Arm)   Pulse 78   Temp 98.6 F (37 C) (Oral)   Resp 18   LMP 02/06/2017 (Exact Date)   SpO2 100%  No data found.   Physical Exam  Constitutional: She is oriented to person, place, and time. She appears well-developed and well-nourished. No distress.  Musculoskeletal: Normal range of motion.       Lumbar back: She exhibits bony tenderness, pain and spasm. She exhibits normal range of motion.  Neurological: She is alert and oriented to person, place, and time. She has normal strength and normal reflexes. She displays normal reflexes. No cranial nerve deficit or sensory deficit. She exhibits normal muscle tone. Coordination and gait normal.  Reflex Scores:      Patellar reflexes are 2+ on the right side and 2+ on the left side.      Achilles reflexes are 2+ on the right side and 2+ on the left side. Skin: She is not diaphoretic.   Lab Results  Component Value Date   CREATININE 0.69 05/01/2012     Urgent Care Course     Procedures (including critical care time)      MDM   1. Chronic midline low back pain  without sciatica   2. Muscle spasm of back    Uncomplicated. Rads normal.  Gait normal.   NSAID and muscle relaxer.  RTC as needed.     Ofilia Neaslark, Ila Landowski L, PA-C 02/06/17 1425    Ofilia Neaslark, Hasnain Manheim L, PA-C 02/06/17 1426

## 2017-02-06 NOTE — ED Triage Notes (Signed)
Pt  Stated   She fell  Yesterday  She  Reports   Low  Back  Pain       she  States  Pain   In  Back  When  She  Moves       She  Ambulated   Slowly  To  Exam room

## 2017-11-06 IMAGING — CR DG CHEST 2V
2 series · 2 of 2 positions shown · non-contrast
Comparison: Chest radiograph November 20, 2015

CLINICAL DATA: Cough and congestion for 2 days.  History of asthma.

EXAM:
CHEST  2 VIEW

[chest pa]
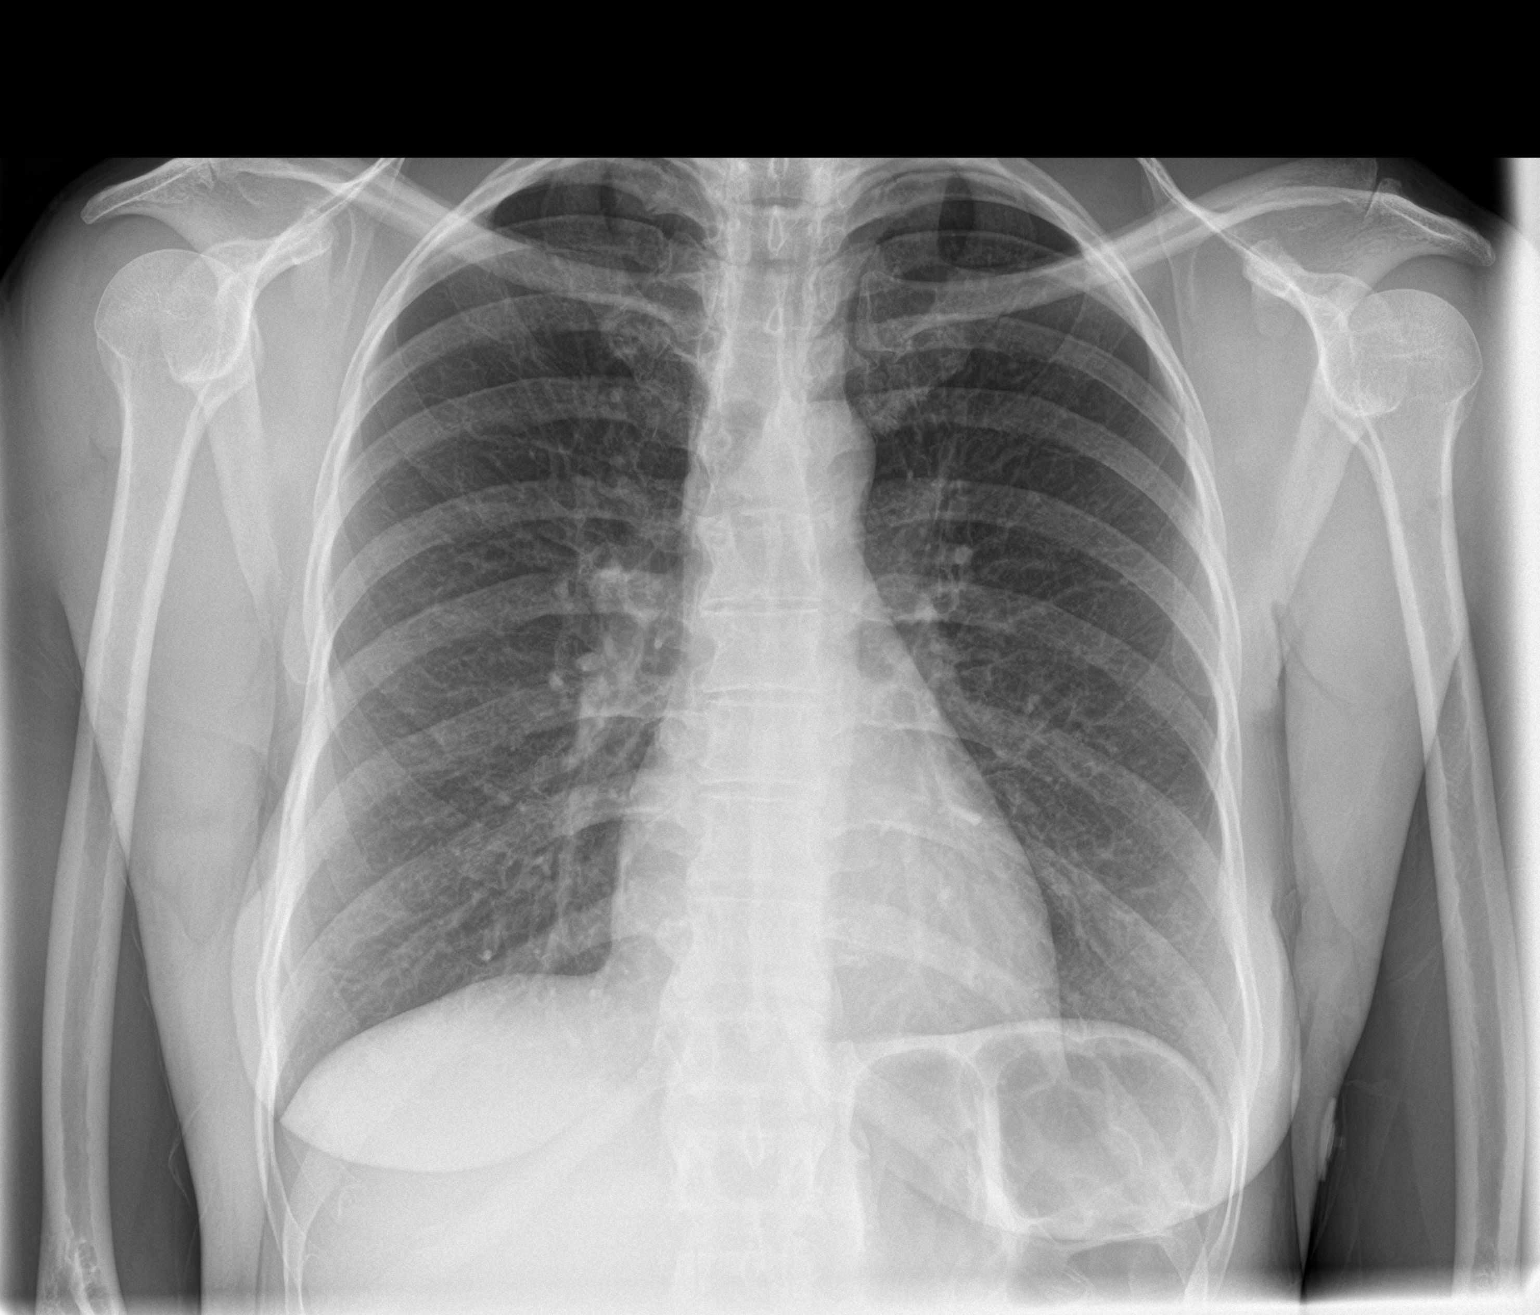

[chest lat]
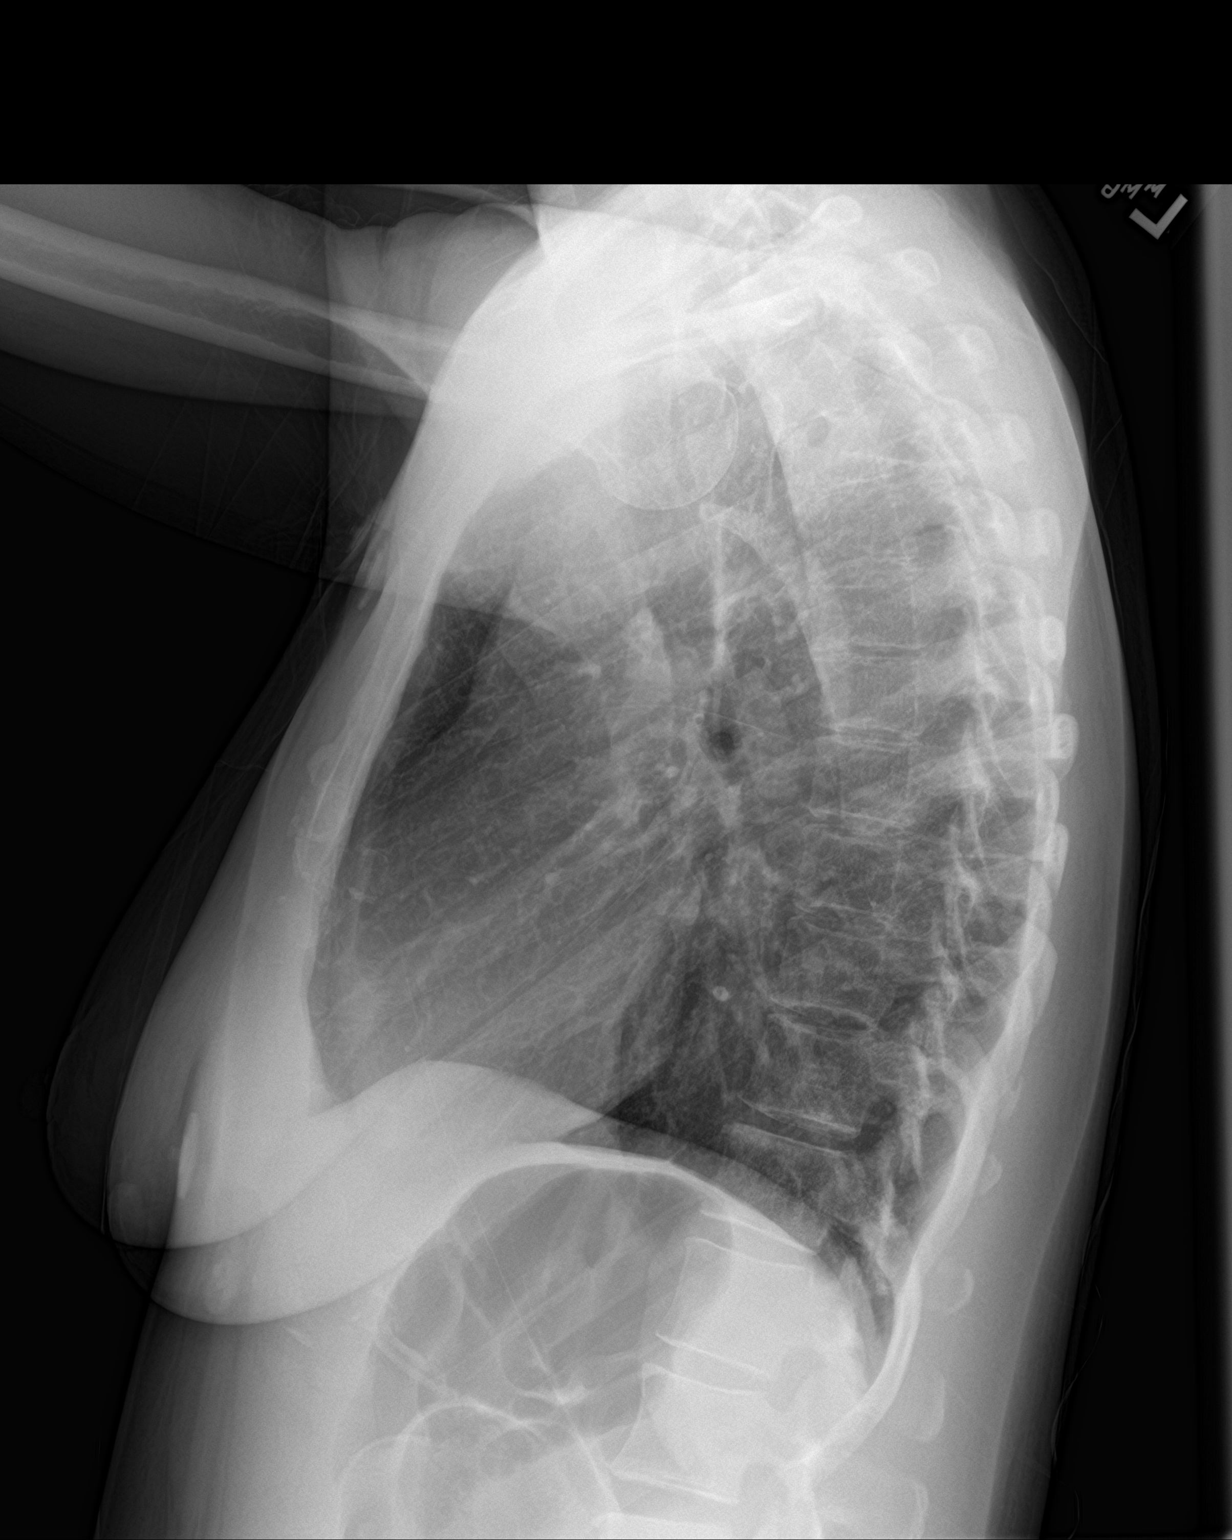

[2 of 2 positions shown; findings below may reference images not displayed]

FINDINGS: Cardiomediastinal silhouette is normal. No pleural effusions or
focal consolidations. Trachea projects midline and there is no
pneumothorax. Soft tissue planes and included osseous structures are
non-suspicious. Mild bilateral acromioclavicular undersurface
spurring.
IMPRESSION: Normal chest radiograph.

## 2018-02-23 ENCOUNTER — Emergency Department (HOSPITAL_COMMUNITY): Payer: Self-pay

## 2018-02-23 ENCOUNTER — Emergency Department (HOSPITAL_COMMUNITY)
Admission: EM | Admit: 2018-02-23 | Discharge: 2018-02-23 | Disposition: A | Discharge status: Home / Self Care | Payer: Self-pay | Attending: Emergency Medicine | Admitting: Emergency Medicine

## 2018-02-23 ENCOUNTER — Encounter (HOSPITAL_COMMUNITY): Payer: Self-pay

## 2018-02-23 DIAGNOSIS — Y929 Unspecified place or not applicable: Secondary | ICD-10-CM | POA: Insufficient documentation

## 2018-02-23 DIAGNOSIS — J45909 Unspecified asthma, uncomplicated: Secondary | ICD-10-CM | POA: Insufficient documentation

## 2018-02-23 DIAGNOSIS — S29011A Strain of muscle and tendon of front wall of thorax, initial encounter: Secondary | ICD-10-CM | POA: Insufficient documentation

## 2018-02-23 DIAGNOSIS — F17210 Nicotine dependence, cigarettes, uncomplicated: Secondary | ICD-10-CM | POA: Insufficient documentation

## 2018-02-23 DIAGNOSIS — Y9389 Activity, other specified: Secondary | ICD-10-CM | POA: Insufficient documentation

## 2018-02-23 DIAGNOSIS — Y999 Unspecified external cause status: Secondary | ICD-10-CM | POA: Insufficient documentation

## 2018-02-23 DIAGNOSIS — X500XXA Overexertion from strenuous movement or load, initial encounter: Secondary | ICD-10-CM | POA: Insufficient documentation

## 2018-02-23 MED ORDER — KETOROLAC TROMETHAMINE 30 MG/ML IJ SOLN
30.0000 mg | Freq: Once | INTRAMUSCULAR | Status: AC
  Administered 2018-02-23: 30 mg via INTRAMUSCULAR
  Filled 2018-02-23: qty 1

## 2018-02-23 MED ORDER — IBUPROFEN 600 MG PO TABS: 600.0000 mg | ORAL_TABLET | Freq: Four times a day (QID) | ORAL | 0 refills | Status: DC | PRN

## 2018-02-23 MED ORDER — HYDROCODONE-ACETAMINOPHEN 5-325 MG PO TABS
1.0000 | ORAL_TABLET | Freq: Once | ORAL | Status: AC
  Administered 2018-02-23: 1 via ORAL
  Filled 2018-02-23: qty 1

## 2018-02-23 MED ORDER — DIAZEPAM 5 MG PO TABS
5.0000 mg | ORAL_TABLET | Freq: Once | ORAL | Status: AC
  Administered 2018-02-23: 5 mg via ORAL
  Filled 2018-02-23: qty 1

## 2018-02-23 MED ORDER — DIAZEPAM 5 MG PO TABS: 5.0000 mg | ORAL_TABLET | Freq: Two times a day (BID) | ORAL | 0 refills | Status: AC

## 2018-02-23 NOTE — ED Notes (Signed)
Patient transported to X-ray 

## 2018-02-23 NOTE — ED Triage Notes (Signed)
Pt presents for evaluation of muscle pain/spasms to R side. 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 Now patient with pain to R breast, arm, and leg. Denies back pain. Pt reports feels like spasm.

## 2018-02-23 NOTE — ED Provider Notes (Signed)
[facility] EMERGENCY DEPARTMENT Provider Note   CSN: [id number] Arrival date & time: 02/23/18  1002     History   Chief Complaint Chief Complaint  Patient presents with  . Generalized Body Aches    HPI Mary [name] is a 42 y.o. female.  Pt presents to the ED with right chest pain.  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  She woke up in the middle of the night with right sided chest pain.  The pt denies any sob.  Pain worse with movement.     Past Medical History:  Diagnosis Date  . Asthma   . Headache(784.0)   . Urinary tract infection     Patient Active Problem List   Diagnosis Date Noted  . Cesarean delivery delivered 12/01/2012  . Prolonged pregnancy, 41 weeks 11/28/2012  . Trichomonas 05/01/2012  . Positive urine pregnancy test 05/01/2012  . Urinary tract infection     Past Surgical History:  Procedure Laterality Date  . CESAREAN SECTION N/A 11/29/2012   Procedure: Primary cesarean section with delivery of baby girl at 1840.  Apgars 8/9.;  Surgeon: Kathreen Cosier, MD;  Location: [facility];  Service: Obstetrics;  Laterality: N/A;  . MULTIPLE TOOTH EXTRACTIONS  08/2012   x 6 teeth  . NO PAST SURGERIES       OB History    Gravida  2   Para  1   Term  1   Preterm      AB  1   Living  1     SAB  1   TAB      Ectopic      Multiple      Live Births  1            Home Medications    Prior to Admission medications   Medication Sig Start Date End Date Taking? Authorizing Provider  albuterol (PROVENTIL HFA;VENTOLIN HFA) 108 (90 BASE) MCG/ACT inhaler Inhale 2 puffs into the lungs every 6 (six) hours as needed for wheezing or shortness of breath. Shortness of breath 05/25/15   Brock Bad, MD  cyclobenzaprine (FLEXERIL) 10 MG tablet Take 0.5-1 tablets (5-10 mg total) by mouth 3 (three) times daily as needed for muscle spasms. 02/06/17   Ofilia Neas, PA-C  diazepam (VALIUM) 5 MG  tablet Take 1 tablet (5 mg total) by mouth 2 (two) times daily. 02/23/18   Jacalyn Lefevre, MD  ibuprofen (ADVIL,MOTRIN) 600 MG tablet Take 1 tablet (600 mg total) by mouth every 6 (six) hours as needed. 02/23/18   Jacalyn Lefevre, MD    Family History Family History  Problem Relation Age of Onset  . Arthritis Mother   . Depression Mother   . Hypertension Mother   . Drug abuse Mother   . Asthma Maternal Aunt   . Hyperlipidemia Maternal Aunt   . Diabetes Maternal Grandmother   . Other Neg Hx   . Alcohol abuse Neg Hx   . Birth defects Neg Hx   . Cancer Neg Hx   . COPD Neg Hx   . Stroke Neg Hx   . Vision loss Neg Hx   . Miscarriages / Stillbirths Neg Hx   . Mental retardation Neg Hx   . Mental illness Neg Hx   . Learning disabilities Neg Hx   . Kidney disease Neg Hx   . Heart disease Neg Hx   . Hearing loss Neg  Hx   . Early death Neg Hx     Social History Social History   Tobacco Use  . Smoking status: Current Every Day Smoker    Packs/day: 0.50    Years: 10.00    Pack years: 5.00    Types: Cigarettes  . Smokeless tobacco: Never Used  Substance Use Topics  . Alcohol use: Yes    Alcohol/week: 0.0 oz  . Drug use: Yes    Types: Marijuana    Comment: 11/20     Allergies   Bee venom; Shellfish allergy; and Penicillins   Review of Systems Review of Systems  Musculoskeletal:       Right sided chest pain  All other systems reviewed and are negative.    Physical Exam Updated Vital Signs BP 121/81 (BP Location: Right Arm)   Pulse 85   Temp 98.3 F (36.8 C) (Oral)   Resp 16   LMP 02/21/2018 (Exact Date)   SpO2 100%   Physical Exam  Constitutional: She is oriented to person, place, and time. She appears well-developed and well-nourished.  HENT:  Head: Normocephalic and atraumatic.  Right Ear: External ear normal.  Left Ear: External ear normal.  Nose: Nose normal.  Mouth/Throat: Oropharynx is clear and moist.  Eyes: Pupils are equal, round, and reactive  to light. Conjunctivae and EOM are normal.  Neck: Normal range of motion. Neck supple.  Cardiovascular: Normal rate, regular rhythm, normal heart sounds and intact distal pulses.  Pulmonary/Chest: Effort normal and breath sounds normal.    Abdominal: Soft. Bowel sounds are normal.  Musculoskeletal: Normal range of motion.  Neurological: She is alert and oriented to person, place, and time.  Skin: Skin is warm. Capillary refill takes less than 2 seconds.  Psychiatric: She has a normal mood and affect. Her behavior is normal. Judgment and thought content normal.  Nursing note and vitals reviewed.    ED Treatments / Results  Labs (all labs ordered are listed, but only abnormal results are displayed) Labs Reviewed - No data to display  EKG EKG Interpretation  Date/Time:  Sunday February 23 2018 10:14:10 EDT Ventricular Rate:  68 PR Interval:  140 QRS Duration: 82 QT Interval:  412 QTC Calculation: 438 R Axis:   -38 Text Interpretation:  Normal sinus rhythm Left axis deviation Abnormal ECG No significant change since last tracing Confirmed by Jacalyn LefevreHaviland, Akua Blethen [id number]) on 02/23/2018 10:41:04 AM   Radiology Dg Chest 2 View  Result Date: 02/23/2018 CLINICAL DATA:  Pt presents for evaluation of muscle pain/spasms to R side. 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 Now patient with pain to R breast, arm, and leg. Denies back pain. Pt reports feels like spasm and feels like she is having tingling and numbness in her right hand EXAM: CHEST - 2 VIEW COMPARISON:  08/15/2016 FINDINGS: The heart size and mediastinal contours are within normal limits. Both lungs are clear. No pleural effusion or pneumothorax. The visualized skeletal structures are unremarkable. IMPRESSION: Normal chest radiographs. Electronically Signed   By: Amie Portlandavid  Ormond M.D.   On: 02/23/2018 11:45   Dg Cervical Spine Complete  Result Date: 02/23/2018 CLINICAL DATA:  Pt presents for  evaluation of muscle pain/spasms to R side. 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 Now patient with pain to R breast, arm, and leg. Denies back pain. Pt reports feels like spasm and feels like she is having tingling  and numbness in her right hand EXAM: CERVICAL SPINE - COMPLETE 4+ VIEW COMPARISON:  None. FINDINGS: No fracture.  No bone lesion.  No spondylolisthesis. Minor loss of disc height at C5-C6. Small endplate osteophytes at C4, C5 and C6. Remaining disc spaces are well preserved. Neural foramina are widely patent. Soft tissues are unremarkable. IMPRESSION: 1. No fracture, spondylolisthesis or acute finding. 2. Minor disc degenerative changes.  No neural foraminal narrowing. Electronically Signed   By: Amie Portland M.D.   On: 02/23/2018 11:47    Procedures Procedures (including critical care time)  Medications Ordered in ED Medications  diazepam (VALIUM) tablet 5 mg (5 mg Oral Given 02/23/18 1154)  ketorolac (TORADOL) 30 MG/ML injection 30 mg (30 mg Intramuscular Given 02/23/18 1157)  HYDROcodone-acetaminophen (NORCO/VICODIN) 5-325 MG per tablet 1 tablet (1 tablet Oral Given 02/23/18 1154)     Initial Impression / Assessment and Plan / ED Course  I have reviewed the triage vital signs and the nursing notes.  Pertinent labs & imaging results that were available during my care of the patient were reviewed by me and considered in my medical decision making (see chart for details).     Pt's sx likely due to muscle strain.  She will be d/c home with valium and ibuprofen.  Return if worse.  Final Clinical Impressions(s) / ED Diagnoses   Final diagnoses:  Muscle strain of chest wall, initial encounter    ED Discharge Orders        Ordered    diazepam (VALIUM) 5 MG tablet  2 times daily     02/23/18 1156    ibuprofen (ADVIL,MOTRIN) 600 MG tablet  Every 6 hours PRN     02/23/18 1156       Jacalyn Lefevre, MD 02/23/18 1157

## 2019-05-06 ENCOUNTER — Emergency Department (HOSPITAL_COMMUNITY)
Admission: EM | Admit: 2019-05-06 | Discharge: 2019-05-06 | Discharge status: Against Medical Advice | Payer: Self-pay | Attending: Emergency Medicine | Admitting: Emergency Medicine

## 2019-05-06 ENCOUNTER — Encounter (HOSPITAL_COMMUNITY): Payer: Self-pay | Admitting: Emergency Medicine

## 2019-05-06 ENCOUNTER — Other Ambulatory Visit: Payer: Self-pay

## 2019-05-06 ENCOUNTER — Ambulatory Visit (HOSPITAL_COMMUNITY)
Admission: EM | Admit: 2019-05-06 | Discharge: 2019-05-06 | Disposition: A | Discharge status: Home / Self Care | Payer: Self-pay | Attending: Family Medicine | Admitting: Family Medicine

## 2019-05-06 ENCOUNTER — Encounter (HOSPITAL_COMMUNITY): Payer: Self-pay

## 2019-05-06 ENCOUNTER — Ambulatory Visit (INDEPENDENT_AMBULATORY_CARE_PROVIDER_SITE_OTHER): Payer: Self-pay

## 2019-05-06 DIAGNOSIS — F17200 Nicotine dependence, unspecified, uncomplicated: Secondary | ICD-10-CM

## 2019-05-06 DIAGNOSIS — M79642 Pain in left hand: Secondary | ICD-10-CM

## 2019-05-06 DIAGNOSIS — S0990XA Unspecified injury of head, initial encounter: Secondary | ICD-10-CM

## 2019-05-06 DIAGNOSIS — J4520 Mild intermittent asthma, uncomplicated: Secondary | ICD-10-CM

## 2019-05-06 DIAGNOSIS — S60222A Contusion of left hand, initial encounter: Secondary | ICD-10-CM

## 2019-05-06 DIAGNOSIS — Z5321 Procedure and treatment not carried out due to patient leaving prior to being seen by health care provider: Secondary | ICD-10-CM | POA: Insufficient documentation

## 2019-05-06 HISTORY — DX: Unspecified asthma, uncomplicated: J45.909

## 2019-05-06 LAB — COMPREHENSIVE METABOLIC PANEL
ALT: 19 U/L (ref 0–44)
AST: 33 U/L (ref 15–41)
Albumin: 3.7 g/dL (ref 3.5–5.0)
Alkaline Phosphatase: 58 U/L (ref 38–126)
Anion gap: 11 (ref 5–15)
BUN: 8 mg/dL (ref 6–20)
CO2: 24 mmol/L (ref 22–32)
Calcium: 9.1 mg/dL (ref 8.9–10.3)
Chloride: 106 mmol/L (ref 98–111)
Creatinine, Ser: 1 mg/dL (ref 0.44–1.00)
GFR calc Af Amer: >60 mL/min (ref >60)
GFR calc non Af Amer: >60 mL/min (ref >60)
Glucose, Bld: 122 mg/dL — ABNORMAL HIGH (ref 70–99)
Potassium: 3.8 mmol/L (ref 3.5–5.1)
Sodium: 141 mmol/L (ref 135–145)
Total Bilirubin: 0.9 mg/dL (ref 0.3–1.2)
Total Protein: 6.7 g/dL (ref 6.5–8.1)

## 2019-05-06 LAB — CBC
HCT: 39 % (ref 36.0–46.0)
Hemoglobin: 12.7 g/dL (ref 12.0–15.0)
MCH: 29.8 pg (ref 26.0–34.0)
MCHC: 32.6 g/dL (ref 30.0–36.0)
MCV: 91.5 fL (ref 80.0–100.0)
Platelets: 347 10*3/uL (ref 150–400)
RBC: 4.26 MIL/uL (ref 3.87–5.11)
RDW: 13.2 % (ref 11.5–15.5)
WBC: 9.7 10*3/uL (ref 4.0–10.5)
nRBC: 0 % (ref 0.0–0.2)

## 2019-05-06 LAB — I-STAT BETA HCG BLOOD, ED (MC, WL, AP ONLY): I-stat hCG, quantitative: <5 m[IU]/mL (ref <5)

## 2019-05-06 LAB — ETHANOL: Alcohol, Ethyl (B): <10 mg/dL (ref <10)

## 2019-05-06 MED ORDER — ALBUTEROL SULFATE HFA 108 (90 BASE) MCG/ACT IN AERS: 1.0000 | INHALATION_SPRAY | Freq: Four times a day (QID) | RESPIRATORY_TRACT | 0 refills | Status: AC | PRN

## 2019-05-06 NOTE — ED Notes (Signed)
Pt called for outside x2 no reply. Pt not seen outside.

## 2019-05-06 NOTE — ED Notes (Signed)
PT did not answer for vitals 2X

## 2019-05-06 NOTE — ED Notes (Signed)
PT did not answer for vital signs

## 2019-05-06 NOTE — ED Notes (Signed)
Called patient to reassess vitals x3 and had no answer. Pt stated that she was going to step outside and sit on the bench. This NT stepped outside and called for patient; however no answer.
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
## 2019-05-06 NOTE — Discharge Instructions (Addendum)
Follow up with your primary care doctor or here within 48-72 hours if you feel you are not improving. Do your best to ensure adequate rest. If not allergic, take acetaminophen (Tylenol) every 4-6 hours as needed for discomfort. Sometimes individuals will develop a headache associated with mild nausea in the days or hours after a head injury. This is called a concussion and may require further follow up.  Please seek prompt medical care if: You have: A very bad (severe) headache that is not helped by medicine. Trouble walking or weakness in your arms and legs. Clear or bloody fluid coming from your nose or ears. Changes in your seeing (vision). Jerky movements that you cannot control (seizure). You throw up (vomit). Your symptoms get worse. You lose balance. Your speech is slurred. You pass out. You are sleepier and have trouble staying awake. The black centers of your eyes (pupils) change in size.  These symptoms may be an emergency. Do not wait to see if the symptoms will go away. Get medical help right away. Call your local emergency services. Do not drive yourself to the hospital.
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
## 2019-05-17 IMAGING — DX DG CERVICAL SPINE COMPLETE 4+V
5 series · 5 of 5 positions shown · non-contrast
Comparison: None.

CLINICAL DATA: Pt presents for evaluation of muscle pain/spasms to
R side. Pt reports cares for a patient who uses [employer] lift and was
doing heavy lifting. Now patient with pain to R breast, arm, and
leg. Denies back pain. Pt reports feels like spasm and feels like
she is having tingling and numbness in her right hand

EXAM:
CERVICAL SPINE - COMPLETE 4+ VIEW

[c-spine obl (1 of 2)]
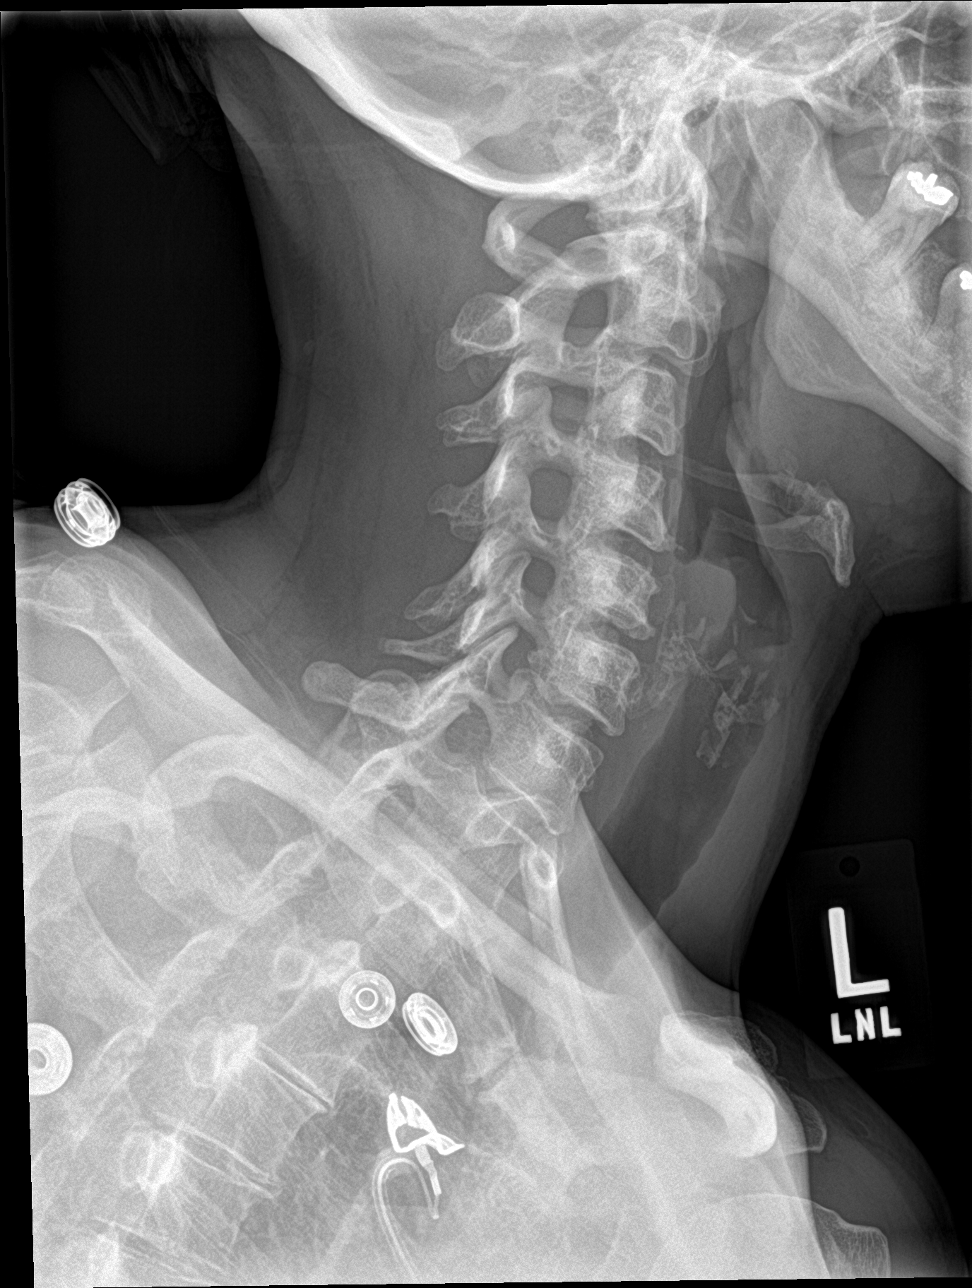

[c-spine obl (2 of 2)]
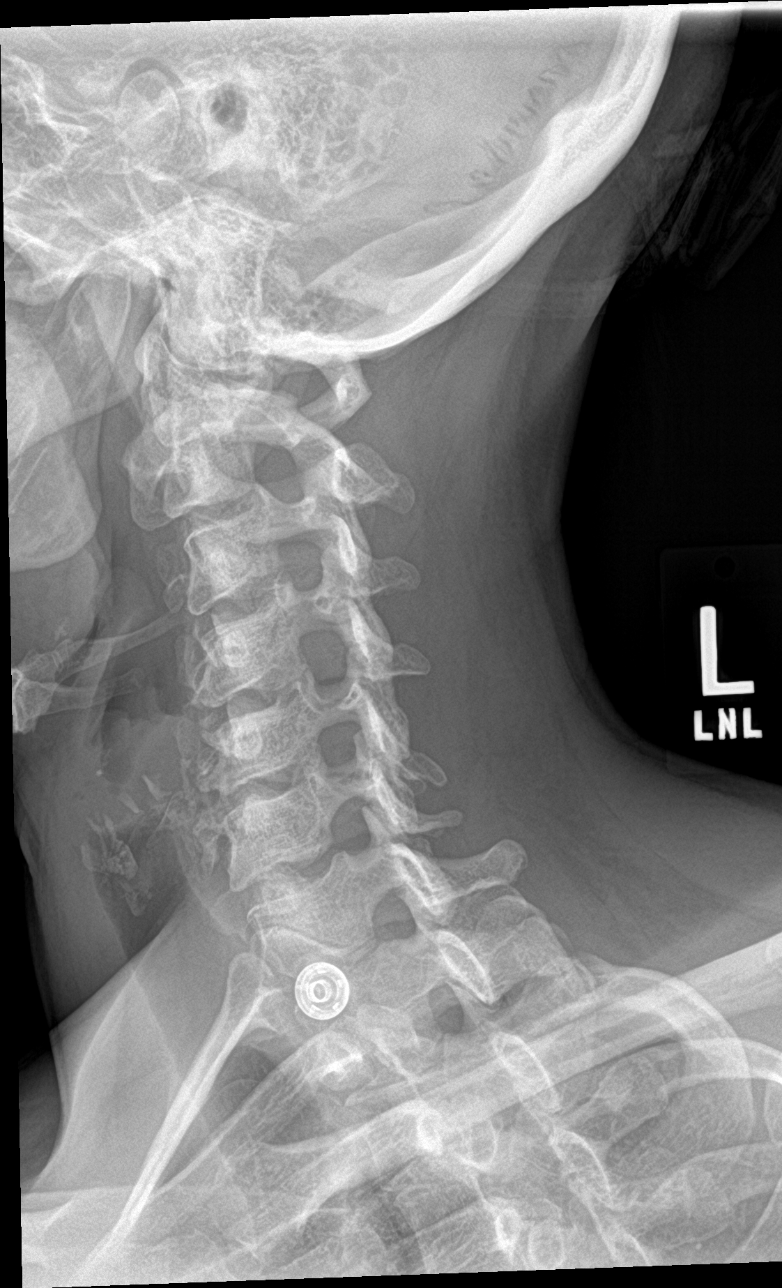

[c-spine ap]
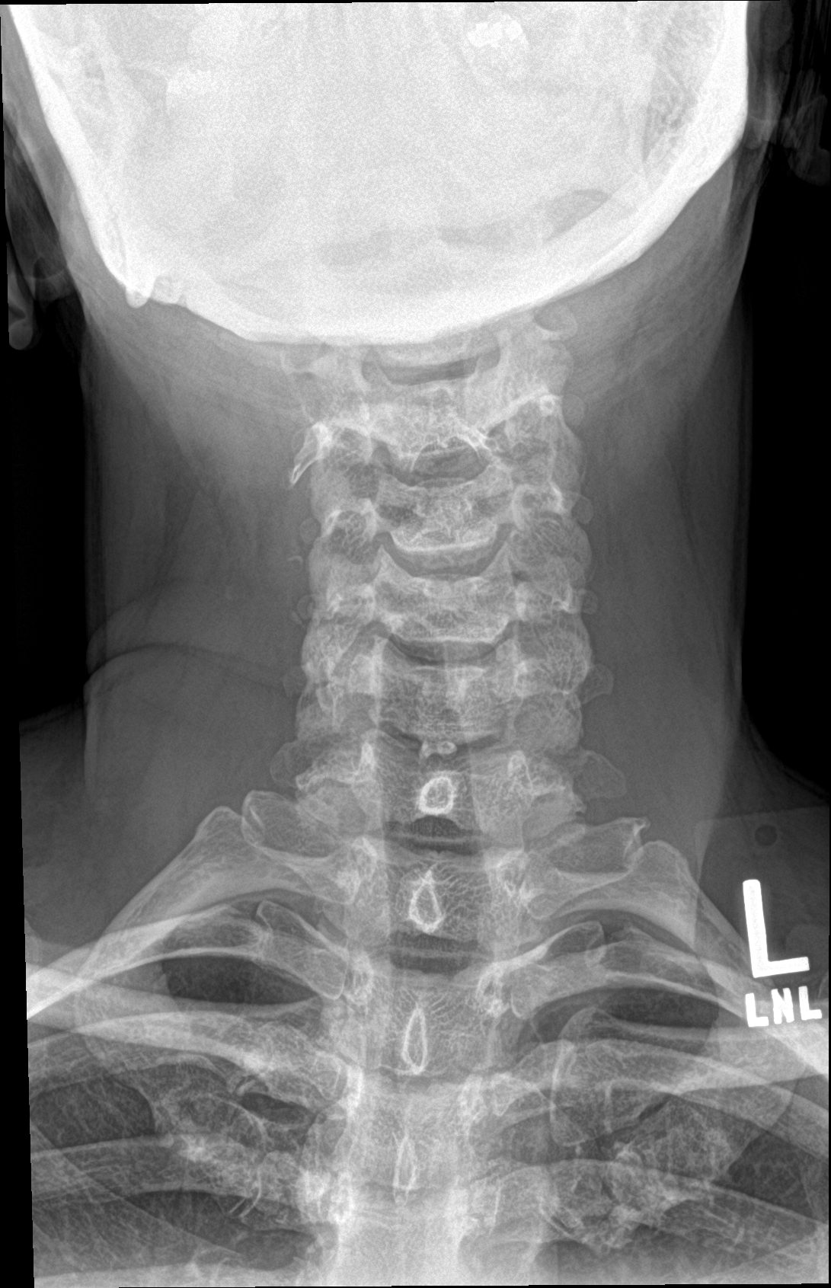

[c-spine open mouth]
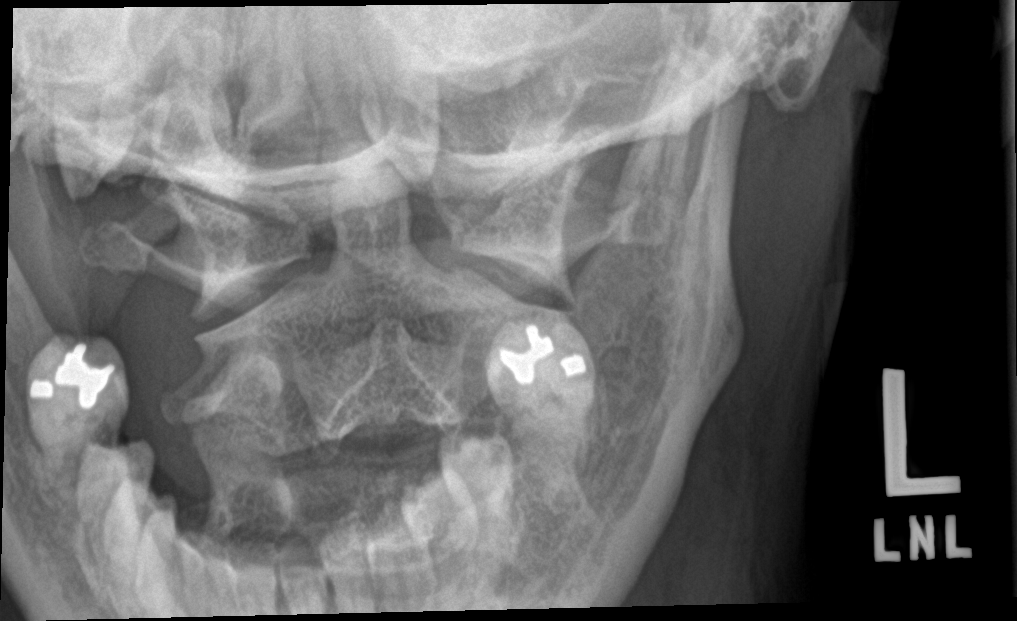

[c-spine lat]
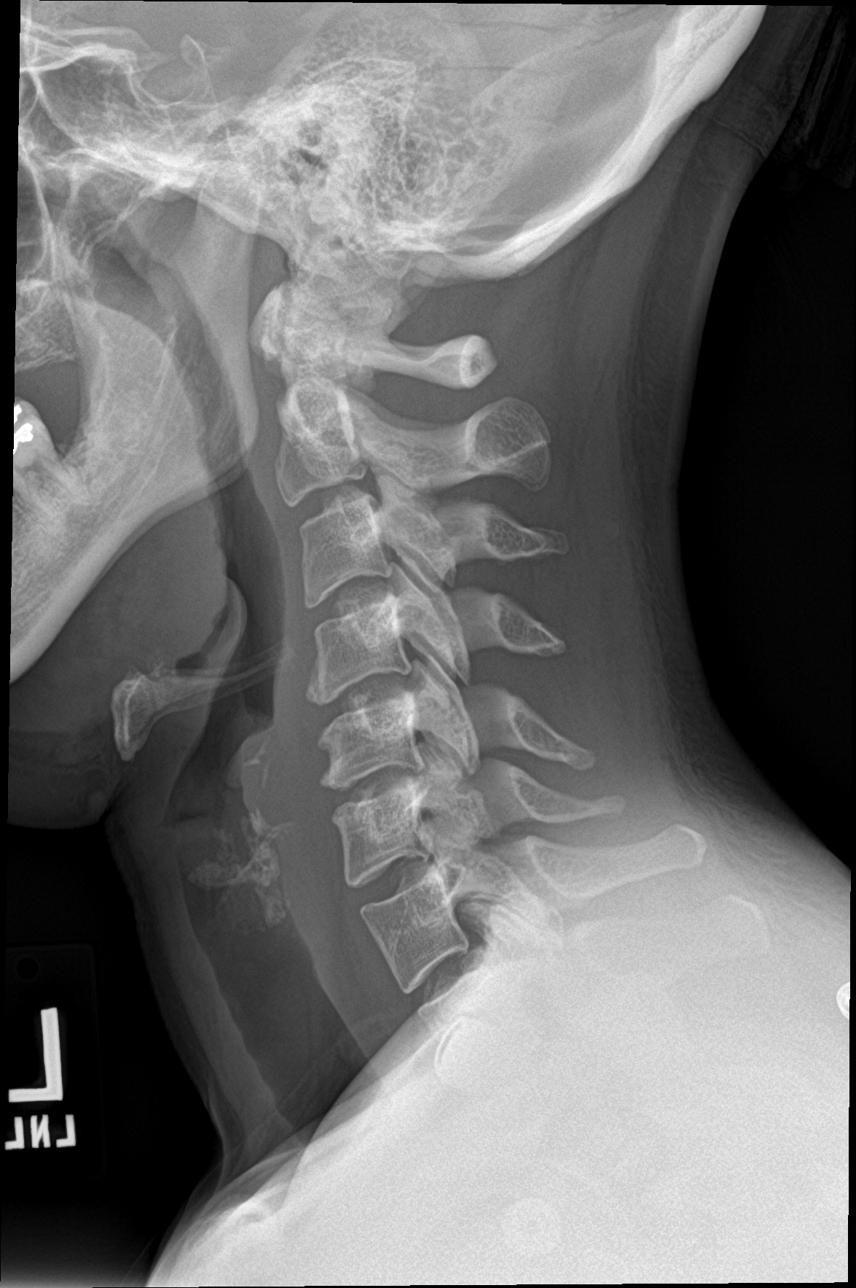

[5 of 5 positions shown; findings below may reference images not displayed]

FINDINGS: No fracture.  No bone lesion.  No spondylolisthesis.

Minor loss of disc height at C5-C6. Small endplate osteophytes at
C4, C5 and C6. Remaining disc spaces are well preserved. Neural
foramina are widely patent.

Soft tissues are unremarkable.
IMPRESSION: 1. No fracture, spondylolisthesis or acute finding.
2. Minor disc degenerative changes.  No neural foraminal narrowing.

## 2019-08-03 ENCOUNTER — Other Ambulatory Visit: Payer: Self-pay

## 2019-08-03 DIAGNOSIS — Z20822 Contact with and (suspected) exposure to covid-19: Secondary | ICD-10-CM

## 2019-08-04 LAB — NOVEL CORONAVIRUS, NAA: SARS-CoV-2, NAA: NOT DETECTED

## 2020-11-08 ENCOUNTER — Ambulatory Visit: Payer: Self-pay

## 2020-11-09 ENCOUNTER — Ambulatory Visit (HOSPITAL_COMMUNITY)
Admission: EM | Admit: 2020-11-09 | Discharge: 2020-11-09 | Disposition: A | Discharge status: Home / Self Care | Payer: Self-pay | Attending: Emergency Medicine | Admitting: Emergency Medicine

## 2020-11-09 ENCOUNTER — Encounter (HOSPITAL_COMMUNITY): Payer: Self-pay

## 2020-11-09 ENCOUNTER — Other Ambulatory Visit: Payer: Self-pay

## 2020-11-09 ENCOUNTER — Ambulatory Visit: Payer: Self-pay

## 2020-11-09 DIAGNOSIS — M5441 Lumbago with sciatica, right side: Secondary | ICD-10-CM

## 2020-11-09 DIAGNOSIS — Z760 Encounter for issue of repeat prescription: Secondary | ICD-10-CM

## 2020-11-09 DIAGNOSIS — Z3202 Encounter for pregnancy test, result negative: Secondary | ICD-10-CM

## 2020-11-09 LAB — POC URINE PREG, ED: Preg Test, Ur: NEGATIVE

## 2020-11-09 MED ORDER — METHOCARBAMOL 500 MG PO TABS: 500.0000 mg | ORAL_TABLET | Freq: Two times a day (BID) | ORAL | 0 refills | Status: AC | PRN

## 2020-11-09 MED ORDER — IBUPROFEN 800 MG PO TABS: 800.0000 mg | ORAL_TABLET | Freq: Three times a day (TID) | ORAL | 0 refills | Status: AC | PRN

## 2020-11-09 MED ORDER — ALBUTEROL SULFATE HFA 108 (90 BASE) MCG/ACT IN AERS: 2.0000 | INHALATION_SPRAY | RESPIRATORY_TRACT | 0 refills | Status: AC | PRN

## 2020-11-09 NOTE — ED Provider Notes (Signed)
[facility]    CSN: [id number] Arrival date & time: 11/09/20  1114      History   Chief Complaint Chief Complaint  Patient presents with  . Back Pain    HPI Mary [name] is a 45 y.o. female.   Patient presents with right lower back pain radiating to her right posterior thigh x3 days.  No falls or injury.  She denies numbness, weakness, loss of control of bowel/bladder, saddle anesthesia, rash, lesions, redness, bruising, abdominal pain, dysuria, or other symptoms.  Treatment attempted at home with relief.  Her medical history includes asthma.  Patient requests a pregnancy test and a refill on her albuterol inhaler.  The history is provided by the patient and medical records.    Past Medical History:  Diagnosis Date  . Asthma   . Headache(784.0)   . Urinary tract infection     Patient Active Problem List   Diagnosis Date Noted  . Cesarean delivery delivered 12/01/2012  . Prolonged pregnancy, 41 weeks 11/28/2012  . Trichomonas 05/01/2012  . Positive urine pregnancy test 05/01/2012  . Urinary tract infection     Past Surgical History:  Procedure Laterality Date  . CESAREAN SECTION N/A 11/29/2012   Procedure: Primary cesarean section with delivery of baby girl at 1840.  Apgars 8/9.;  Surgeon: Kathreen Cosier, MD;  Location: [facility];  Service: Obstetrics;  Laterality: N/A;  . MULTIPLE TOOTH EXTRACTIONS  08/2012   x 6 teeth  . NO PAST SURGERIES      OB History    Gravida  2   Para  1   Term  1   Preterm      AB  1   Living  1     SAB  1   IAB      Ectopic      Multiple      Live Births  1            Home Medications    Prior to Admission medications   Medication Sig Start Date End Date Taking? Authorizing Provider  albuterol (VENTOLIN HFA) 108 (90 Base) MCG/ACT inhaler Inhale 2 puffs into the lungs every 4 (four) hours as needed for wheezing or shortness of breath. 11/09/20  Yes Mickie Bail, NP  ibuprofen (ADVIL) 800 MG tablet  Take 1 tablet (800 mg total) by mouth every 8 (eight) hours as needed. 11/09/20  Yes Mickie Bail, NP  methocarbamol (ROBAXIN) 500 MG tablet Take 1 tablet (500 mg total) by mouth 2 (two) times daily as needed for muscle spasms. 11/09/20  Yes Mickie Bail, NP  diazepam (VALIUM) 5 MG tablet Take 1 tablet (5 mg total) by mouth 2 (two) times daily. 02/23/18   Jacalyn Lefevre, MD    Family History Family History  Problem Relation Age of Onset  . Arthritis Mother   . Depression Mother   . Hypertension Mother   . Drug abuse Mother   . Asthma Maternal Aunt   . Hyperlipidemia Maternal Aunt   . Diabetes Maternal Grandmother   . Other Neg Hx   . Alcohol abuse Neg Hx   . Birth defects Neg Hx   . Cancer Neg Hx   . COPD Neg Hx   . Stroke Neg Hx   . Vision loss Neg Hx   . Miscarriages / Stillbirths Neg Hx   . Mental retardation Neg Hx   . Mental illness Neg Hx   . Learning disabilities Neg Hx   .  Kidney disease Neg Hx   . Heart disease Neg Hx   . Hearing loss Neg Hx   . Early death Neg Hx     Social History Social History   Tobacco Use  . Smoking status: Current Every Day Smoker    Packs/day: 0.50    Years: 10.00    Pack years: 5.00    Types: Cigarettes  . Smokeless tobacco: Never Used  Substance Use Topics  . Alcohol use: Yes    Alcohol/week: 0.0 standard drinks  . Drug use: Yes    Types: Marijuana    Comment: 11/20     Allergies   Bee venom, Shellfish allergy, and Penicillins   Review of Systems Review of Systems  Constitutional: Negative for chills and fever.  HENT: Negative for ear pain and sore throat.   Eyes: Negative for pain and visual disturbance.  Respiratory: Negative for cough and shortness of breath.   Cardiovascular: Negative for chest pain and palpitations.  Gastrointestinal: Negative for abdominal pain and vomiting.  Genitourinary: Negative for dysuria and hematuria.  Musculoskeletal: Positive for back pain. Negative for arthralgias and gait problem.   Skin: Negative for color change and rash.  Neurological: Negative for syncope, weakness and numbness.  All other systems reviewed and are negative.    Physical Exam Triage Vital Signs ED Triage Vitals  Enc Vitals Group     BP      Pulse      Resp      Temp      Temp src      SpO2      Weight      Height      Head Circumference      Peak Flow      Pain Score      Pain Loc      Pain Edu?      Excl. in GC?    No data found.  Updated Vital Signs BP 122/70 (BP Location: Right Arm)   Pulse 76   Temp 97.8 F (36.6 C) (Oral)   Resp 18   LMP 10/24/2020   SpO2 100%   Visual Acuity Right Eye Distance:   Left Eye Distance:   Bilateral Distance:    Right Eye Near:   Left Eye Near:    Bilateral Near:     Physical Exam Vitals and nursing note reviewed.  Constitutional:      General: She is not in acute distress.    Appearance: She is well-developed and well-nourished. She is not ill-appearing.  HENT:     Head: Normocephalic and atraumatic.     Mouth/Throat:     Mouth: Mucous membranes are moist.  Eyes:     Conjunctiva/sclera: Conjunctivae normal.  Cardiovascular:     Rate and Rhythm: Normal rate and regular rhythm.     Heart sounds: Normal heart sounds.  Pulmonary:     Effort: Pulmonary effort is normal. No respiratory distress.     Breath sounds: Normal breath sounds.  Abdominal:     Palpations: Abdomen is soft.     Tenderness: There is no abdominal tenderness. There is no right CVA tenderness, left CVA tenderness, guarding or rebound.  Musculoskeletal:        General: Tenderness present. No swelling, deformity or edema. Normal range of motion.     Cervical back: Neck supple.       Back:  Skin:    General: Skin is warm and dry.     Findings: No bruising,  erythema, lesion or rash.  Neurological:     General: No focal deficit present.     Mental Status: She is alert and oriented to person, place, and time.     Sensory: No sensory deficit.     Motor: No  weakness.     Gait: Gait normal.     Comments: Negative straight leg raise.  Psychiatric:        Mood and Affect: Mood and affect and mood normal.        Behavior: Behavior normal.      UC Treatments / Results  Labs (all labs ordered are listed, but only abnormal results are displayed) Labs Reviewed  POC URINE PREG, ED    EKG   Radiology No results found.  Procedures Procedures (including critical care time)  Medications Ordered in UC Medications - No data to display  Initial Impression / Assessment and Plan / UC Course  I have reviewed the triage vital signs and the nursing notes.  Pertinent labs & imaging results that were available during my care of the patient were reviewed by me and considered in my medical decision making (see chart for details).   Acute right lower back pain with right sciatica.  Negative pregnancy test.  Encounter for medication refill.  Urine pregnancy negative.  Refill of albuterol inhaler provided per patient request.  Treating sciatica with ibuprofen and Robaxin.  Precautions for drowsiness with muscle relaxer discussed.  Instructed patient to follow-up with her PCP or an orthopedist if her symptoms are not improving.  She agrees to plan of care.   Final Clinical Impressions(s) / UC Diagnoses   Final diagnoses:  Acute right-sided low back pain with right-sided sciatica  Negative pregnancy test  Encounter for medication refill     Discharge Instructions     Take ibuprofen as needed for discomfort.  Take the muscle relaxer as needed for muscle spasm; Do not drive, operate machinery, or drink alcohol with this medication as it can cause drowsiness. Follow up with your primary care provider or an orthopedist if your symptoms are not improving.    Your pregnancy test is negative.    Use the albuterol inhaler as directed.         ED Prescriptions    Medication Sig Dispense Auth. Provider   albuterol (VENTOLIN HFA) 108 (90 Base)  MCG/ACT inhaler Inhale 2 puffs into the lungs every 4 (four) hours as needed for wheezing or shortness of breath. 18 g Mickie Bail, NP   ibuprofen (ADVIL) 800 MG tablet Take 1 tablet (800 mg total) by mouth every 8 (eight) hours as needed. 21 tablet Mickie Bail, NP   methocarbamol (ROBAXIN) 500 MG tablet Take 1 tablet (500 mg total) by mouth 2 (two) times daily as needed for muscle spasms. 10 tablet Mickie Bail, NP     I have reviewed the PDMP during this encounter.   Mickie Bail, NP 11/09/20 [phone]

## 2020-11-09 NOTE — Discharge Instructions (Addendum)
Take ibuprofen as needed for discomfort.  Take the muscle relaxer as needed for muscle spasm; Do not drive, operate machinery, or drink alcohol with this medication as it can cause drowsiness. Follow up with your primary care provider or an orthopedist if your symptoms are not improving.    Your pregnancy test is negative.    Use the albuterol inhaler as directed.

## 2020-11-09 NOTE — ED Triage Notes (Signed)
Pt c/o lower right lumbar back pain radiating to right thigh for approx 3 days that increases with movement, twisting and turning.  Pt states she had "similar back pain when I was pregnant", and is requesting preg test.   Denies dysuria symptoms, changes to urine quality/odor, hematuria, n/v/d, fever.  Took aleve yesterday w/o improvement to pain. 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  Pt is requesting refill of albuterol inhaler and flexeril

## 2020-11-14 ENCOUNTER — Ambulatory Visit: Payer: Self-pay

## 2020-11-22 ENCOUNTER — Ambulatory Visit: Payer: Self-pay | Admitting: Obstetrics

## 2021-10-14 ENCOUNTER — Other Ambulatory Visit: Payer: Self-pay

## 2021-10-14 ENCOUNTER — Encounter (HOSPITAL_COMMUNITY): Payer: Self-pay

## 2021-10-14 ENCOUNTER — Emergency Department (HOSPITAL_COMMUNITY): Payer: No Typology Code available for payment source

## 2021-10-14 ENCOUNTER — Emergency Department (HOSPITAL_COMMUNITY)
Admission: EM | Admit: 2021-10-14 | Discharge: 2021-10-14 | Disposition: A | Discharge status: Home / Self Care | Payer: No Typology Code available for payment source | Attending: Emergency Medicine | Admitting: Emergency Medicine

## 2021-10-14 DIAGNOSIS — Z79899 Other long term (current) drug therapy: Secondary | ICD-10-CM | POA: Diagnosis not present

## 2021-10-14 DIAGNOSIS — Y92412 Parkway as the place of occurrence of the external cause: Secondary | ICD-10-CM | POA: Insufficient documentation

## 2021-10-14 DIAGNOSIS — M25562 Pain in left knee: Secondary | ICD-10-CM | POA: Insufficient documentation

## 2021-10-14 DIAGNOSIS — M25532 Pain in left wrist: Secondary | ICD-10-CM | POA: Diagnosis not present

## 2021-10-14 MED ORDER — NAPROXEN 500 MG PO TABS
500.0000 mg | ORAL_TABLET | Freq: Two times a day (BID) | ORAL | 0 refills | Status: AC
Start: 2021-10-14 — End: ?

## 2021-10-14 MED ORDER — CYCLOBENZAPRINE HCL 10 MG PO TABS: 10.0000 mg | ORAL_TABLET | Freq: Two times a day (BID) | ORAL | 0 refills | Status: AC | PRN

## 2021-10-14 MED ORDER — HYDROCODONE-ACETAMINOPHEN 5-325 MG PO TABS
1.0000 | ORAL_TABLET | Freq: Once | ORAL | Status: AC
  Administered 2021-10-14: 1 via ORAL
  Filled 2021-10-14: qty 1

## 2021-10-14 MED ORDER — LIDOCAINE 5 % EX PTCH: 1.0000 | MEDICATED_PATCH | CUTANEOUS | 0 refills | Status: AC
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
